# Patient Record
Sex: Female | Born: 1975 | Race: White | Hispanic: No | Marital: Married | Smoking: Never smoker
Health system: Southern US, Community
[De-identification: ages and names within clinical notes are randomized; demographics above are authoritative.]

## PROBLEM LIST (undated history)

## (undated) DIAGNOSIS — K649 Unspecified hemorrhoids: Secondary | ICD-10-CM

## (undated) DIAGNOSIS — I4891 Unspecified atrial fibrillation: Secondary | ICD-10-CM

## (undated) DIAGNOSIS — N390 Urinary tract infection, site not specified: Secondary | ICD-10-CM

## (undated) DIAGNOSIS — K589 Irritable bowel syndrome without diarrhea: Secondary | ICD-10-CM

## (undated) DIAGNOSIS — R519 Headache, unspecified: Secondary | ICD-10-CM

## (undated) DIAGNOSIS — F32A Depression, unspecified: Secondary | ICD-10-CM

## (undated) DIAGNOSIS — E538 Deficiency of other specified B group vitamins: Secondary | ICD-10-CM

## (undated) DIAGNOSIS — B019 Varicella without complication: Secondary | ICD-10-CM

## (undated) DIAGNOSIS — E559 Vitamin D deficiency, unspecified: Secondary | ICD-10-CM

## (undated) DIAGNOSIS — R55 Syncope and collapse: Secondary | ICD-10-CM

## (undated) DIAGNOSIS — I499 Cardiac arrhythmia, unspecified: Secondary | ICD-10-CM

## (undated) DIAGNOSIS — M1990 Unspecified osteoarthritis, unspecified site: Secondary | ICD-10-CM

## (undated) DIAGNOSIS — I951 Orthostatic hypotension: Secondary | ICD-10-CM

## (undated) DIAGNOSIS — G43909 Migraine, unspecified, not intractable, without status migrainosus: Secondary | ICD-10-CM

## (undated) HISTORY — DX: Vitamin D deficiency, unspecified: E55.9

## (undated) HISTORY — DX: Headache, unspecified: R51.9

## (undated) HISTORY — DX: Urinary tract infection, site not specified: N39.0

## (undated) HISTORY — DX: Unspecified hemorrhoids: K64.9

## (undated) HISTORY — DX: Unspecified osteoarthritis, unspecified site: M19.90

## (undated) HISTORY — DX: Varicella without complication: B01.9

## (undated) HISTORY — DX: Migraine, unspecified, not intractable, without status migrainosus: G43.909

## (undated) HISTORY — DX: Unspecified atrial fibrillation: I48.91

## (undated) HISTORY — DX: Orthostatic hypotension: I95.1

## (undated) HISTORY — DX: Irritable bowel syndrome, unspecified: K58.9

## (undated) HISTORY — DX: Depression, unspecified: F32.A

## (undated) HISTORY — DX: Deficiency of other specified B group vitamins: E53.8

## (undated) HISTORY — DX: Syncope and collapse: R55

## (undated) HISTORY — DX: Cardiac arrhythmia, unspecified: I49.9

---

## 2002-02-13 ENCOUNTER — Emergency Department (HOSPITAL_COMMUNITY): Admission: EM | Admit: 2002-02-13 | Discharge: 2002-02-13 | Payer: Self-pay | Admitting: *Deleted

## 2006-10-08 ENCOUNTER — Inpatient Hospital Stay (HOSPITAL_COMMUNITY): Admission: AD | Admit: 2006-10-08 | Discharge: 2006-10-13 | Payer: Self-pay | Admitting: Obstetrics & Gynecology

## 2006-10-09 ENCOUNTER — Encounter (INDEPENDENT_AMBULATORY_CARE_PROVIDER_SITE_OTHER): Payer: Self-pay | Admitting: Specialist

## 2007-12-11 ENCOUNTER — Emergency Department (HOSPITAL_COMMUNITY): Admission: EM | Admit: 2007-12-11 | Discharge: 2007-12-11 | Payer: Self-pay | Admitting: Emergency Medicine

## 2007-12-31 ENCOUNTER — Encounter: Admission: RE | Admit: 2007-12-31 | Discharge: 2007-12-31 | Payer: Self-pay | Admitting: General Surgery

## 2009-09-05 ENCOUNTER — Inpatient Hospital Stay (HOSPITAL_COMMUNITY): Admission: RE | Admit: 2009-09-05 | Discharge: 2009-09-08 | Payer: Self-pay | Admitting: Obstetrics and Gynecology

## 2009-11-01 IMAGING — CR DG HAND 2V*L*
2 series · 2 of 2 positions shown · non-contrast
Comparison: Plain films 12/11/2007

CLINICAL DATA: Fourth metacarpal fracture.  Status post casting.

LEFT HAND - 2 VIEW

[view not recorded (1 of 2)]
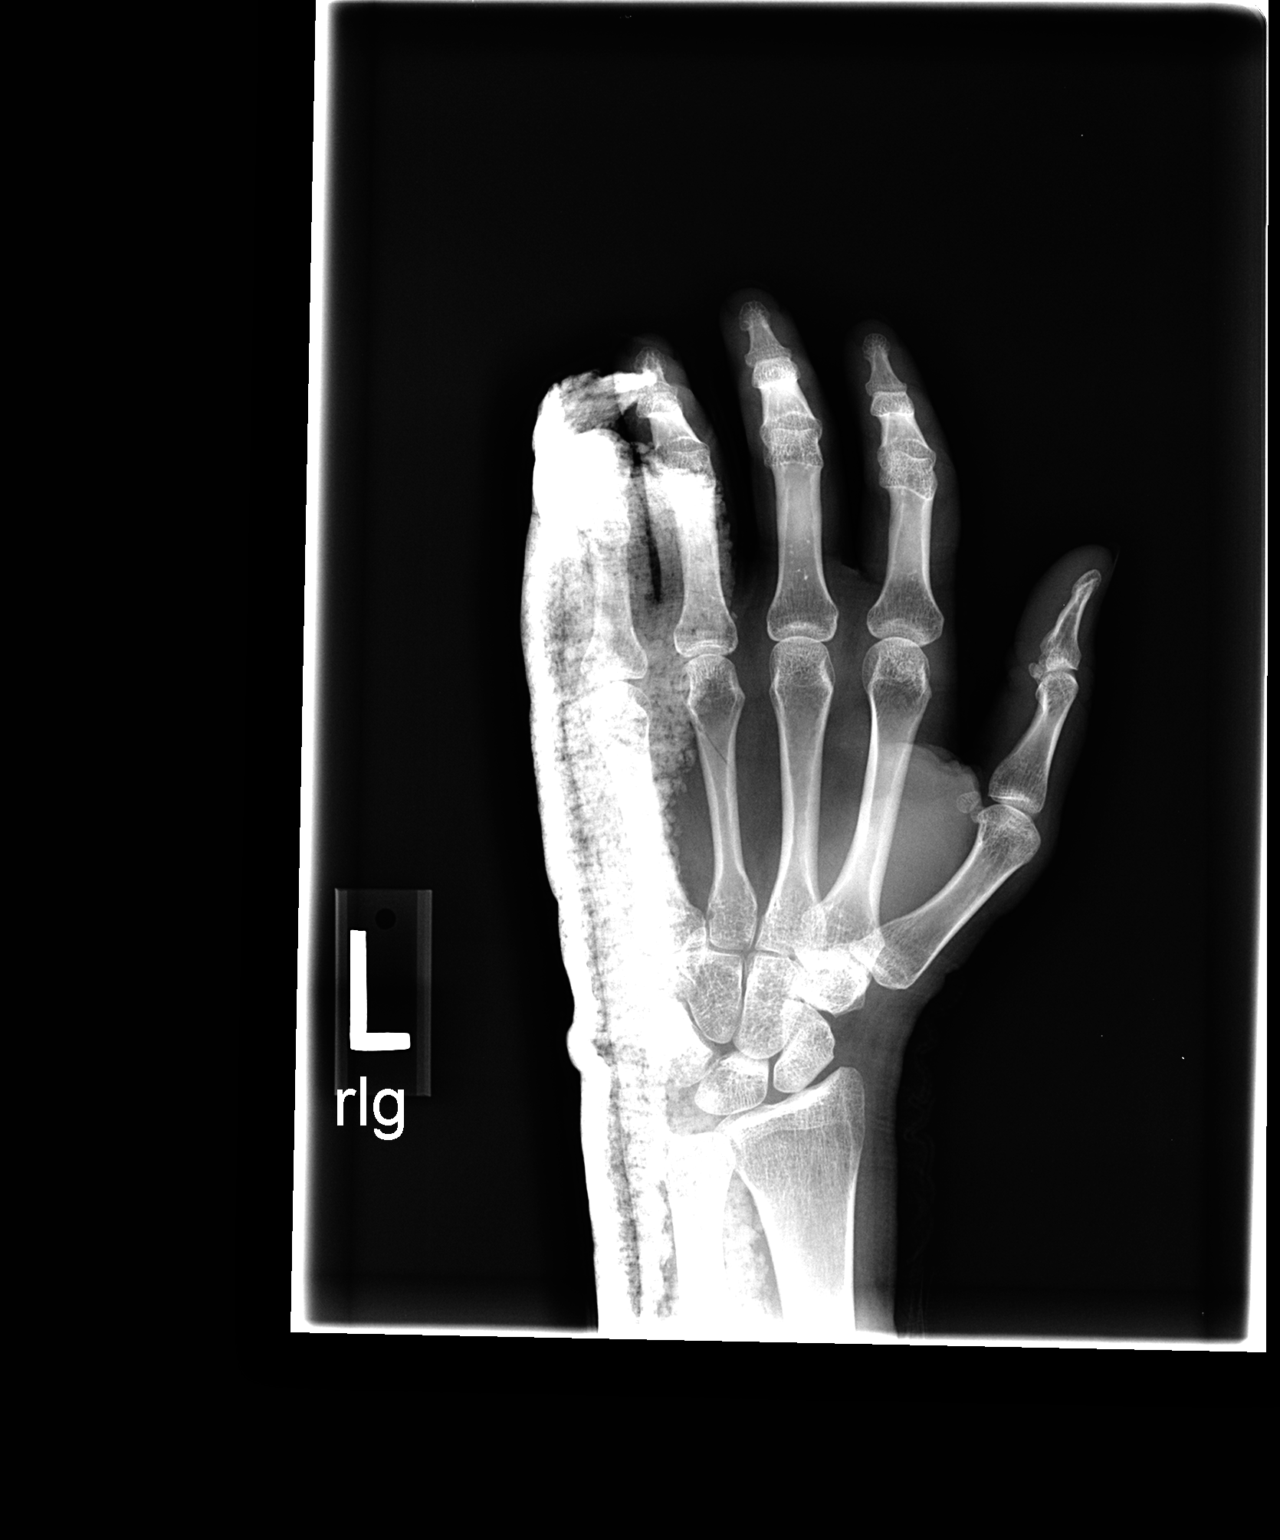

[view not recorded (2 of 2)]
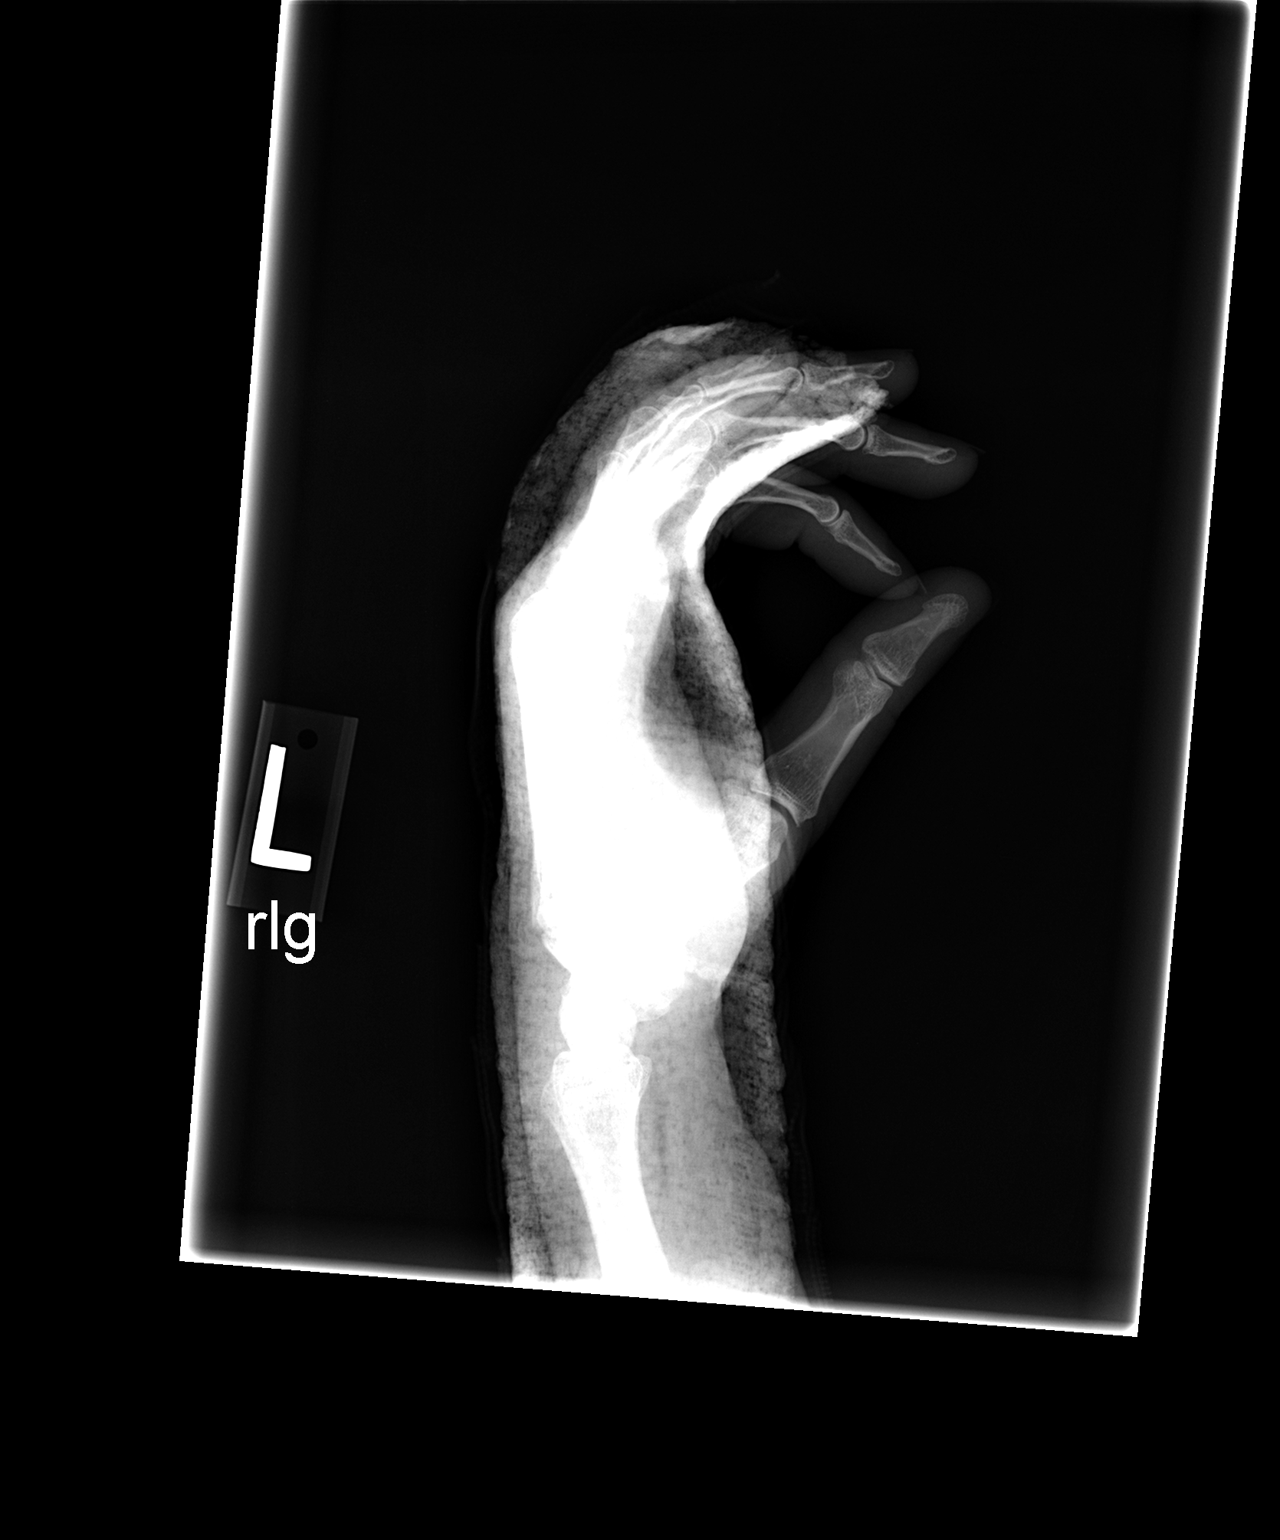

[2 of 2 positions shown; findings below may reference images not displayed]

FINDINGS: The patient is in a plaster splint.  As on the prior
examination, a nondisplaced fracture of the distal aspect of the
fourth metacarpal is identified.  There has been some bony
resorption about the fracture site with the knee fracture line
clearly visible..  No new abnormality.
IMPRESSION: Healing fourth metacarpal fracture without change in position or
alignment.

## 2010-09-08 LAB — CBC
HCT: 31.6 % — ABNORMAL LOW (ref 36.0–46.0)
Hemoglobin: 10.9 g/dL — ABNORMAL LOW (ref 12.0–15.0)
Hemoglobin: 13.2 g/dL (ref 12.0–15.0)
MCV: 95.7 fL (ref 78.0–100.0)
RBC: 3.3 MIL/uL — ABNORMAL LOW (ref 3.87–5.11)
RBC: 4.09 MIL/uL (ref 3.87–5.11)
WBC: 10.2 10*3/uL (ref 4.0–10.5)

## 2010-11-01 NOTE — Discharge Summary (Signed)
[name], Paula Cooke NO.:  [id number]   MEDICAL RECORD NO.:  [id number]           PATIENT TYPE:   LOCATION:                                FACILITY:  [facility]   PHYSICIAN:  Genia Del, M.D.DATE OF BIRTH:  Feb 17, 1976   DATE OF ADMISSION:  10/08/2006  DATE OF DISCHARGE:  10/13/2006                               DISCHARGE SUMMARY   ADMISSION DIAGNOSIS:  Forty plus weeks' gestation, spontaneous labor.   DISCHARGE DIAGNOSIS:  Forty plus weeks' gestation, spontaneous labor,  nonreassuring fetal heart rate monitoring with fetal bradycardia, two-  vessel cord.   PROCEDURE:  Urgent low transverse C-section.  Birth of a baby boy.  PH  7.25.  No complication with the C-section.  The estimated blood loss was  400 mL.  Postoperative evaluation was normal.  The patient was  discharged home in stable status on postoperative day #4.  She had a  blood patch by anesthesia for a spinal headache.  Postoperative advice  was given.  Her postoperative hemoglobin was 9.9.  Pain medication was  prescribed, and she will follow-up at [facility] in 6 weeks.      Genia Del, M.D.  Electronically Signed     ML/MEDQ  D:  12/08/2006  T:  12/08/2006  Job:  [id number]

## 2010-11-01 NOTE — Op Note (Signed)
[name], Paula Cooke            ACCOUNT NO.:  [id number]   MEDICAL RECORD NO.:  [id number]          PATIENT TYPE:  INP   LOCATION:  9119                          FACILITY:  [facility]   PHYSICIAN:  Genia Del, M.D.DATE OF BIRTH:  18-Nov-1975   DATE OF PROCEDURE:  10/09/2006  DATE OF DISCHARGE:                               OPERATIVE REPORT   PREOPERATIVE DIAGNOSES:  1. Forty plus weeks' gestation.  2. Fetal bradycardia in second stage of labor.  3. Two-vessel cord.   POSTOPERATIVE DIAGNOSES:  1. Forty plus weeks' gestation.  2. Fetal bradycardia in second stage of labor.  3. Two-vessel cord.  4. Two nuchal cords.   PROCEDURE:  Urgent low transverse cesarean section.   SURGEON:  Genia Del, M.D.  No assistant.   ANESTHESIOLOGIST:  Belva Agee, M.D.   PROCEDURE:  Under epidural anesthesia, the patient is in 15-degree left  decubitus position.  She is prepped with Betadine on the abdominal,  suprapubic and vulvar areas.  The bladder catheter is already in place  and the patient is draped as usual.  We make a Pfannenstiel incision  with a scalpel, open the adipose tissue with the scalpel, open the  aponeurosis transversely with Mayo scissors, detach the recti muscles  from the aponeurosis in the midline, open the parietal peritoneum  longitudinally with [employer].  We then opened the visceral  peritoneum transversely over the lower uterine segment, reclined the  bladder downward.  The bladder retractor is in place.  We make a low  transverse hysterotomy with a scalpel, extension on each side with the  fingers.  The amniotic fluid is clear.  The fetus is in cephalic  presentation.  Birth of a baby boy at 5:20.  the cord is clamped and  cut.  The baby was suctioned and given to the neonatal team.  Apgars are  9 and 9, pH is 7.25.  We then extract the placenta manually.  It has two  vessels.  It is sent to pathology.  We make a uterine revision.  Pitocin  was  started in the IV fluid.  The uterus contracts well.  A dose of  Ancef 1 g IV given.  We close the hysterectomy and a first locked  running suture of Vicryl 0, the second layer in a mattress fashion is  done with Vicryl 0.  Hemostasis is good.  Both ovaries are normal to  inspection.  The uterus is normal except for one very small less than 1  cm subserosal myoma.  We irrigate and suction the abdominopelvic cavity.  Hemostasis is completed on the recti muscles with the electrocautery.  We close the aponeurosis in one running suture of Vicryl 0.  Hemostasis  is completed on the adipose tissue and the skin is reapproximated with  staples.  A dry dressing is applied.  The estimated blood loss is 400  mL.  No complication  occurred and the patient was brought to recovery room in good, stable  status.  Note that an x-ray was done of the abdomen and the pelvis to  assure that no instruments and sponges  were left given that the count  was not done.  The x-ray was negative.      Genia Del, M.D.  Electronically Signed     ML/MEDQ  D:  10/09/2006  T:  10/09/2006  Job:  [id number]

## 2011-03-13 LAB — POCT I-STAT, CHEM 8
BUN: 12
Calcium, Ion: 1.1 — ABNORMAL LOW
Creatinine, Ser: 0.9
TCO2: 23

## 2013-02-25 DIAGNOSIS — F411 Generalized anxiety disorder: Secondary | ICD-10-CM | POA: Insufficient documentation

## 2016-03-10 ENCOUNTER — Ambulatory Visit (INDEPENDENT_AMBULATORY_CARE_PROVIDER_SITE_OTHER): Payer: BLUE CROSS/BLUE SHIELD | Admitting: Psychology

## 2016-03-10 DIAGNOSIS — F4323 Adjustment disorder with mixed anxiety and depressed mood: Secondary | ICD-10-CM | POA: Diagnosis not present

## 2016-03-25 ENCOUNTER — Ambulatory Visit (INDEPENDENT_AMBULATORY_CARE_PROVIDER_SITE_OTHER): Payer: BLUE CROSS/BLUE SHIELD | Admitting: Psychology

## 2016-03-25 DIAGNOSIS — F411 Generalized anxiety disorder: Secondary | ICD-10-CM

## 2016-04-14 ENCOUNTER — Ambulatory Visit (INDEPENDENT_AMBULATORY_CARE_PROVIDER_SITE_OTHER): Payer: BLUE CROSS/BLUE SHIELD | Admitting: Psychology

## 2016-04-14 DIAGNOSIS — F4323 Adjustment disorder with mixed anxiety and depressed mood: Secondary | ICD-10-CM | POA: Diagnosis not present

## 2016-04-29 ENCOUNTER — Ambulatory Visit (INDEPENDENT_AMBULATORY_CARE_PROVIDER_SITE_OTHER): Payer: BLUE CROSS/BLUE SHIELD | Admitting: Psychology

## 2016-04-29 DIAGNOSIS — F4323 Adjustment disorder with mixed anxiety and depressed mood: Secondary | ICD-10-CM

## 2016-05-13 ENCOUNTER — Ambulatory Visit (INDEPENDENT_AMBULATORY_CARE_PROVIDER_SITE_OTHER): Payer: BLUE CROSS/BLUE SHIELD | Admitting: Psychology

## 2016-05-13 DIAGNOSIS — F4323 Adjustment disorder with mixed anxiety and depressed mood: Secondary | ICD-10-CM

## 2016-05-27 ENCOUNTER — Ambulatory Visit (INDEPENDENT_AMBULATORY_CARE_PROVIDER_SITE_OTHER): Payer: BLUE CROSS/BLUE SHIELD | Admitting: Psychology

## 2016-05-27 DIAGNOSIS — F4323 Adjustment disorder with mixed anxiety and depressed mood: Secondary | ICD-10-CM

## 2016-06-17 ENCOUNTER — Ambulatory Visit: Payer: Self-pay | Admitting: Psychology

## 2016-07-01 ENCOUNTER — Ambulatory Visit: Payer: BLUE CROSS/BLUE SHIELD | Admitting: Psychology

## 2019-03-17 ENCOUNTER — Other Ambulatory Visit: Payer: Self-pay | Admitting: Emergency Medicine

## 2019-03-17 DIAGNOSIS — Z20822 Contact with and (suspected) exposure to covid-19: Secondary | ICD-10-CM

## 2019-03-18 LAB — NOVEL CORONAVIRUS, NAA: SARS-CoV-2, NAA: NOT DETECTED

## 2019-06-17 HISTORY — PX: CARDIAC ELECTROPHYSIOLOGY MAPPING AND ABLATION: SHX1292

## 2020-06-16 DIAGNOSIS — N6009 Solitary cyst of unspecified breast: Secondary | ICD-10-CM

## 2020-06-16 HISTORY — DX: Solitary cyst of unspecified breast: N60.09

## 2021-02-13 LAB — HM MAMMOGRAPHY

## 2021-07-25 LAB — HM DEXA SCAN: HM Dexa Scan: NORMAL

## 2021-08-08 LAB — TSH: TSH: 1.84 (ref 0.41–5.90)

## 2021-08-08 LAB — BASIC METABOLIC PANEL
BUN: 8 (ref 4–21)
CO2: 21 (ref 13–22)
Chloride: 105 (ref 99–108)
Creatinine: 0.9 (ref 0.5–1.1)
Glucose: 87
Potassium: 4.7 mEq/L (ref 3.5–5.1)
Sodium: 138 (ref 137–147)

## 2021-08-08 LAB — HEPATIC FUNCTION PANEL
ALT: 14 U/L (ref 7–35)
AST: 18 (ref 13–35)
Alkaline Phosphatase: 66 (ref 25–125)
Bilirubin, Total: 0.3

## 2021-08-08 LAB — CBC AND DIFFERENTIAL
HCT: 43 (ref 36–46)
Hemoglobin: 13.9 (ref 12.0–16.0)
Neutrophils Absolute: 2.6
Platelets: 289 10*3/uL (ref 150–400)
WBC: 5.1

## 2021-08-08 LAB — COMPREHENSIVE METABOLIC PANEL
Albumin: 4.4 (ref 3.5–5.0)
Calcium: 9.2 (ref 8.7–10.7)
Globulin: 2.2
eGFR: 78

## 2021-08-08 LAB — CBC: RBC: 4.69 (ref 3.87–5.11)

## 2021-08-08 LAB — VITAMIN D 25 HYDROXY (VIT D DEFICIENCY, FRACTURES): Vit D, 25-Hydroxy: 67.5

## 2021-08-08 LAB — LIPID PANEL
Cholesterol: 171 (ref 0–200)
HDL: 45 (ref 35–70)
LDL Cholesterol: 107
LDl/HDL Ratio: 2.3
Triglycerides: 101 (ref 40–160)

## 2021-08-08 LAB — VITAMIN B12: Vitamin B-12: >2000

## 2021-09-27 DIAGNOSIS — I340 Nonrheumatic mitral (valve) insufficiency: Secondary | ICD-10-CM

## 2021-09-27 HISTORY — DX: Nonrheumatic mitral (valve) insufficiency: I34.0

## 2022-01-20 LAB — POCT ERYTHROCYTE SEDIMENTATION RATE, NON-AUTOMATED: Sed Rate: 3

## 2022-02-14 HISTORY — PX: COLONOSCOPY: SHX174

## 2022-03-09 LAB — HM PAP SMEAR: Pap: NEGATIVE

## 2022-03-15 LAB — HM COLONOSCOPY

## 2022-04-08 ENCOUNTER — Encounter: Payer: Self-pay | Admitting: Family Medicine

## 2022-04-10 ENCOUNTER — Encounter: Payer: Self-pay | Admitting: Family Medicine

## 2022-04-11 ENCOUNTER — Ambulatory Visit (INDEPENDENT_AMBULATORY_CARE_PROVIDER_SITE_OTHER): Payer: No Typology Code available for payment source | Admitting: Family Medicine

## 2022-04-11 ENCOUNTER — Encounter: Payer: Self-pay | Admitting: Family Medicine

## 2022-04-11 VITALS — BP 124/84 | HR 110 | Temp 98.3°F | Ht 66.5 in | Wt 152.4 lb

## 2022-04-11 DIAGNOSIS — Z8679 Personal history of other diseases of the circulatory system: Secondary | ICD-10-CM

## 2022-04-11 DIAGNOSIS — G43909 Migraine, unspecified, not intractable, without status migrainosus: Secondary | ICD-10-CM

## 2022-04-11 DIAGNOSIS — Z23 Encounter for immunization: Secondary | ICD-10-CM | POA: Diagnosis not present

## 2022-04-11 DIAGNOSIS — M2550 Pain in unspecified joint: Secondary | ICD-10-CM

## 2022-04-11 DIAGNOSIS — Z9889 Other specified postprocedural states: Secondary | ICD-10-CM

## 2022-04-11 DIAGNOSIS — Z7689 Persons encountering health services in other specified circumstances: Secondary | ICD-10-CM

## 2022-04-11 DIAGNOSIS — F411 Generalized anxiety disorder: Secondary | ICD-10-CM

## 2022-04-11 LAB — C-REACTIVE PROTEIN: CRP: <1 mg/dL (ref 0.5–20.0)

## 2022-04-11 LAB — SEDIMENTATION RATE: Sed Rate: 11 mm/hr (ref 0–20)

## 2022-04-11 MED ORDER — DULOXETINE HCL 30 MG PO CPEP: 30.0000 mg | ORAL_CAPSULE | Freq: Every day | ORAL | 1 refills | Status: DC

## 2022-04-11 NOTE — Progress Notes (Unsigned)
Patient ID: Paula Cooke, female  DOB: 12-Mar-1976, 46 y.o.   MRN: [id number] Patient Care Team    Relationship Specialty Notifications Start End  Ma Hillock, DO PCP - General Family Medicine  04/11/22   Hart Carwin, MD Referring Physician Cardiology  04/08/22   Eusebio Friendly, MD Referring Physician Gastroenterology  04/08/22   Preston Fleeting, [place] Physician Assistant Neurology  04/10/22     Chief Complaint  Patient presents with   Establish Care    Np Est care pt c/o inflammation in joints. Not fasting    Subjective:  Paula Cooke is a 46 y.o.  female present for new patient establishment. All past medical history, surgical history, allergies, family history, immunizations, medications and social history were updated in the electronic medical record today. All recent labs, ED visits and hospitalizations within the last year were reviewed.  ***       No data to display             No data to display                 No data to display           Immunization History  Administered Date(s) Administered   Influenza Inj Mdck Quad Pf 03/28/2019, 03/18/2021   Influenza Inj Mdck Quad With Preservative 05/14/2015   Influenza Split 03/28/2019   Influenza, Quadrivalent, Recombinant, Inj, Pf 03/17/2017   Influenza,inj,Quad PF,6+ Mos 03/22/2018   Influenza-Unspecified 05/26/2012, 02/25/2013, 04/10/2014, 05/18/2016, 05/18/2017   Tdap 03/12/2013    No results found.  Past Medical History:  Diagnosis Date   Arrhythmia    Arthritis    Chicken pox    Depression    Fainting episodes    Frequent headaches    Migraines    Recurrent UTI (urinary tract infection)    Allergies  Allergen Reactions   Doxycycline Hyclate     Other reaction(s): Other Hot flashes    *** The histories are not reviewed yet. Please review them in the "History" navigator section and refresh this [place]. Family History  Problem Relation Age of Onset    Alzheimer's disease Mother    Stroke Father    Hypertension Father    Hypertension Sister    Arthritis Maternal Grandmother    Heart attack Maternal Grandmother    Alzheimer's disease Maternal Grandfather    Cancer Paternal Grandmother    Stroke Paternal Grandfather    Social History   Social History Narrative   Marital status/children/pets: Married.  2 children.   Education/employment: College degree.   Safety:      -smoke alarm in the home:Yes     - wears seatbelt: Yes     - Feels safe in their relationships: Yes       Allergies as of 04/11/2022       Reactions   Doxycycline Hyclate    Other reaction(s): Other Hot flashes         Medication List        Accurate as of April 11, 2022 10:00 AM. If you have any questions, ask your nurse or doctor.          Ajovy 225 MG/1.5ML Soaj Generic drug: Fremanezumab-vfrm Inject into the skin.   aspirin 81 MG chewable tablet Chew by mouth.   baclofen 20 MG tablet Commonly known as: LIORESAL TAKE ONE TABLET EVERY 8 HOURS AS NEEDED FOR MODERATE HEADACHE PAIN.   busPIRone 15 MG tablet Commonly known  as: BUSPAR Take 15 mg by mouth 2 (two) times daily.   CRANBERRY PO Take by mouth.   DAILY FIBER PO Take by mouth.   MAGNESIUM PO Take by mouth daily.   melatonin 3 MG Tabs tablet Take 3 mg by mouth as needed.   naratriptan 2.5 MG tablet Commonly known as: AMERGE Take 2.5 mg by mouth daily.   PROBIOTIC PO Take by mouth daily.   Riboflavin 400 MG Tabs Take 400 mg by mouth daily.   zonisamide 25 MG capsule Commonly known as: ZONEGRAN Take 25 mg by mouth 3 (three) times daily.        All past medical history, surgical history, allergies, family history, immunizations andmedications were updated in the EMR today and reviewed under the history and medication portions of their EMR.    No results found for this or any previous visit (from the past 2160 hour(s)).  No results found.   ROS 14 pt review  of systems performed and negative (unless mentioned in an HPI)  Objective:  There were no vitals taken for this visit.  Physical Exam      Assessment/plan: Paula Cooke is a 46 y.o. female present for ***   Patient was encouraged to exercise greater than 150 minutes a week. Patient was encouraged to choose a diet filled with fresh fruits and vegetables, and lean meats. AVS provided to patient today for education/recommendation on gender specific health and safety maintenance. No follow-ups on file.  No orders of the defined types were placed in this encounter.  No orders of the defined types were placed in this encounter.  Referral Orders  No referral(s) requested today     Note is dictated utilizing voice recognition software. Although note has been proof read prior to signing, occasional typographical errors still can be missed. If any questions arise, please do not hesitate to call for verification.  Electronically signed by: Felix Pacini, DO [facility] [place]

## 2022-04-11 NOTE — Patient Instructions (Signed)
No follow-ups on file.        Great to see you today.  I have refilled the medication(s) we provide.   If labs were collected, we will inform you of lab results once received either by echart message or telephone call.   - echart message- for normal results that have been seen by the patient already.   - telephone call: abnormal results or if patient has not viewed results in their echart.  

## 2022-04-12 LAB — RHEUMATOID FACTOR: Rhuematoid fact SerPl-aCnc: <14 IU/mL (ref <14)

## 2022-04-13 LAB — ANA, IFA COMPREHENSIVE PANEL
Anti Nuclear Antibody (ANA): POSITIVE — AB
ENA SM Ab Ser-aCnc: <1 AI
SM/RNP: <1 AI
SSA (Ro) (ENA) Antibody, IgG: <1 AI
SSB (La) (ENA) Antibody, IgG: <1 AI
Scleroderma (Scl-70) (ENA) Antibody, IgG: <1 AI
ds DNA Ab: <1 IU/mL

## 2022-04-13 LAB — ANTI-NUCLEAR AB-TITER (ANA TITER)
ANA TITER: 1:320 {titer} — ABNORMAL HIGH
ANA Titer 1: 1:80 {titer} — ABNORMAL HIGH

## 2022-04-13 LAB — CYCLIC CITRUL PEPTIDE ANTIBODY, IGG: Cyclic Citrullin Peptide Ab: <16 UNITS

## 2022-04-14 ENCOUNTER — Telehealth: Payer: Self-pay | Admitting: Family Medicine

## 2022-04-14 ENCOUNTER — Encounter: Payer: Self-pay | Admitting: Family Medicine

## 2022-04-14 DIAGNOSIS — Z9889 Other specified postprocedural states: Secondary | ICD-10-CM | POA: Insufficient documentation

## 2022-04-14 DIAGNOSIS — M2550 Pain in unspecified joint: Secondary | ICD-10-CM | POA: Insufficient documentation

## 2022-04-14 NOTE — Telephone Encounter (Signed)
Please inform patient: Her autoimmune lab came back with a positive titer.  It is a lower end positive titer which sometimes can reflect early autoimmune disorder and sometimes is considered normal. I have referred her to rheumatology to further evaluate now that we have a positive lab work.  They should be calling her to get her scheduled. Her rheumatoid factor is negative

## 2022-04-14 NOTE — Telephone Encounter (Signed)
Spoke with patient regarding results/recommendations.  

## 2022-05-23 ENCOUNTER — Ambulatory Visit: Payer: No Typology Code available for payment source | Admitting: Family Medicine

## 2022-05-23 ENCOUNTER — Encounter: Payer: Self-pay | Admitting: Family Medicine

## 2022-05-23 VITALS — BP 123/83 | HR 115 | Temp 98.2°F | Ht 66.5 in | Wt 153.0 lb

## 2022-05-23 DIAGNOSIS — F411 Generalized anxiety disorder: Secondary | ICD-10-CM | POA: Diagnosis not present

## 2022-05-23 DIAGNOSIS — M2550 Pain in unspecified joint: Secondary | ICD-10-CM | POA: Diagnosis not present

## 2022-05-23 MED ORDER — FLUOXETINE HCL 10 MG PO CAPS: 10.0000 mg | ORAL_CAPSULE | Freq: Every day | ORAL | 1 refills | Status: DC

## 2022-05-23 NOTE — Progress Notes (Signed)
Patient ID: Paula Cooke, female  DOB: 1975/11/03, 46 y.o.   MRN: [id number] Patient Care Team    Relationship Specialty Notifications Start End  Ma Hillock, DO PCP - General Family Medicine  04/11/22   Hart Carwin, MD Referring Physician Cardiology  04/08/22   Eusebio Friendly, MD Referring Physician Gastroenterology  04/08/22   Preston Fleeting, [place] Physician Assistant Neurology  04/10/22     Chief Complaint  Patient presents with   Depression    Cmc; pt is not fasting    Subjective:  Paula Cooke is a 46 y.o.  female present for follow up All past medical history, surgical history, allergies, family history, immunizations, medications and social history were updated in the electronic medical record today. All recent labs, ED visits and hospitalizations within the last year were reviewed. GAD (generalized anxiety disorder) Patient reports she started the Cymbalta for about 1.5 weeks and she felt awful.  She states she fell off and very blunted in her emotions.  She states she is agreeable to trying a different medication today to help with her anxiety.  Prior note: Patient reports she was started on BuSpar for her anxiety and it worked okay.  She does not feel it is giving her adequate coverage at this time.  She is compliant with BuSpar 15 mg twice daily.  She states she was also prescribed Prozac but she never started the medication.  She has also been prescribed Klonopin in the past, but does not use frequently and is not using currently.      05/23/2022   10:25 AM 04/11/2022   10:34 AM 04/11/2022   10:03 AM  Depression screen PHQ 2/9  Decreased Interest 0 1 1  Down, Depressed, Hopeless 0 1 1  PHQ - 2 Score 0 2 2  Altered sleeping 1    Tired, decreased energy 1    Change in appetite 0    Feeling bad or failure about yourself  0    Trouble concentrating 0    Moving slowly or fidgety/restless 0    Suicidal thoughts 0    PHQ-9 Score 2         05/23/2022   10:25 AM  GAD 7 : Generalized Anxiety Score  Nervous, Anxious, on Edge 2  Control/stop worrying 0  Worry too much - different things 1  Trouble relaxing 0  Restless 0  Easily annoyed or irritable 1  Afraid - awful might happen 0  Total GAD 7 Score 4          04/11/2022   10:03 AM  Fall Risk   Falls in the past year? 0  Number falls in past yr: 0  Injury with Fall? 0     Immunization History  Administered Date(s) Administered   Influenza Inj Mdck Quad Pf 03/28/2019, 03/18/2021   Influenza Inj Mdck Quad With Preservative 05/14/2015   Influenza Split 03/28/2019   Influenza, Quadrivalent, Recombinant, Inj, Pf 03/17/2017   Influenza,inj,Quad PF,6+ Mos 03/22/2018, 04/11/2022   Influenza-Unspecified 05/26/2012, 02/25/2013, 04/10/2014, 05/18/2016, 05/18/2017   Tdap 03/12/2013    No results found.  Past Medical History:  Diagnosis Date   Arthritis    Atrial fibrillation ([place])    s/p ablation   B12 deficiency    Breast cyst 2022   with aspiration   Chicken pox    Depression    Fainting episodes    Frequent headaches    Hemorrhoid    IBS (irritable bowel  syndrome)    Migraines    Mitral valve regurgitation 09/27/2021   Mild   Orthostatic hypotension    Recurrent UTI (urinary tract infection)    Postcoital   Vitamin D deficiency    Allergies  Allergen Reactions   Doxycycline Hyclate     Other reaction(s): Other Hot flashes    Past Surgical History:  Procedure Laterality Date   CARDIAC ELECTROPHYSIOLOGY MAPPING AND ABLATION  2021   A-Fib   CESAREAN SECTION     2008 & 2011   COLONOSCOPY  02/2022   Dr. Cindee Salt   Family History  Problem Relation Age of Onset   Alzheimer's disease Mother    Stroke Father    Hypertension Father    Hypertension Sister    Arthritis Maternal Grandmother    Heart attack Maternal Grandmother    Alzheimer's disease Maternal Grandfather    Cancer Paternal Grandmother    Stroke Paternal Grandfather    Social History    Social History Narrative   Marital status/children/pets: Married.  2 children.   Education/employment: College degree.   Safety:      -smoke alarm in the home:Yes     - wears seatbelt: Yes     - Feels safe in their relationships: Yes       Allergies as of 05/23/2022       Reactions   Doxycycline Hyclate    Other reaction(s): Other Hot flashes         Medication List        Accurate as of May 23, 2022 12:12 PM. If you have any questions, ask your nurse or doctor.          STOP taking these medications    DULoxetine 30 MG capsule Commonly known as: Cymbalta Stopped by: Howard Pouch, DO       TAKE these medications    Ajovy 225 MG/1.5ML Soaj Generic drug: Fremanezumab-vfrm Inject into the skin.   aspirin 81 MG chewable tablet Chew by mouth.   baclofen 20 MG tablet Commonly known as: LIORESAL TAKE ONE TABLET EVERY 8 HOURS AS NEEDED FOR MODERATE HEADACHE PAIN.   busPIRone 15 MG tablet Commonly known as: BUSPAR Take 15 mg by mouth 2 (two) times daily.   CRANBERRY PO Take by mouth.   DAILY FIBER PO Take by mouth.   FLUoxetine 10 MG capsule Commonly known as: PROZAC Take 1 capsule (10 mg total) by mouth daily. Started by: Howard Pouch, DO   MAGNESIUM PO Take by mouth daily.   melatonin 3 MG Tabs tablet Take 3 mg by mouth as needed.   naratriptan 2.5 MG tablet Commonly known as: AMERGE Take 2.5 mg by mouth daily.   PROBIOTIC PO Take by mouth daily.   Riboflavin 400 MG Tabs Take 400 mg by mouth daily.   zonisamide 50 MG capsule Commonly known as: ZONEGRAN Take 50 mg by mouth at bedtime.        All past medical history, surgical history, allergies, family history, immunizations andmedications were updated in the EMR today and reviewed under the history and medication portions of their EMR.     No results found.   ROS 14 pt review of systems performed and negative (unless mentioned in an HPI)  Objective: BP 123/83   Pulse  (!) 115   Temp 98.2 F (36.8 C) (Oral)   Ht 5' 6.5" (1.689 m)   Wt 153 lb (69.4 kg)   LMP 05/06/2022 (Approximate)   SpO2 100%   BMI 24.32  kg/m  Physical Exam Vitals and nursing note reviewed.  Constitutional:      General: She is not in acute distress.    Appearance: Normal appearance. She is normal weight. She is not ill-appearing or toxic-appearing.  Eyes:     Extraocular Movements: Extraocular movements intact.     Conjunctiva/sclera: Conjunctivae normal.     Pupils: Pupils are equal, round, and reactive to light.  Neurological:     Mental Status: She is alert and oriented to person, place, and time. Mental status is at baseline.  Psychiatric:        Mood and Affect: Mood normal.        Behavior: Behavior normal.        Thought Content: Thought content normal.        Judgment: Judgment normal.     Assessment/plan: Paula Cooke is a 46 y.o. female present for  GAD (generalized anxiety disorder) Reviewed discussed options today for treatment and she is agreeable to try the Prozac 10 mg daily.  She does have a bottle of these at home that she never started from prior. If working well for her follow-up in 3 months and we can taper at that time if needed.  If needing further intervention sooner please follow-up at that time Tried:celexa, lexapro, buspar, cymbalta   Return in about 11 weeks (around 08/08/2022) for Routine chronic condition follow-up.  No orders of the defined types were placed in this encounter.  Meds ordered this encounter  Medications   FLUoxetine (PROZAC) 10 MG capsule    Sig: Take 1 capsule (10 mg total) by mouth daily.    Dispense:  90 capsule    Refill:  1    Hold until pt request   Referral Orders  No referral(s) requested today     Note is dictated utilizing voice recognition software. Although note has been proof read prior to signing, occasional typographical errors still can be missed. If any questions arise, please do not hesitate to  call for verification.  Electronically signed by: Felix Pacini, DO [facility] [place]

## 2022-05-23 NOTE — Patient Instructions (Addendum)
Return in about 11 weeks (around 08/08/2022) for Routine chronic condition follow-up.        Great to see you today.  I have refilled the medication(s) we provide.   If labs were collected, we will inform you of lab results once received either by echart message or telephone call.   - echart message- for normal results that have been seen by the patient already.   - telephone call: abnormal results or if patient has not viewed results in their echart.

## 2022-08-08 ENCOUNTER — Encounter: Payer: Self-pay | Admitting: Family Medicine

## 2022-08-08 ENCOUNTER — Ambulatory Visit: Payer: No Typology Code available for payment source | Admitting: Family Medicine

## 2022-08-08 VITALS — BP 114/80 | HR 100 | Temp 98.2°F | Ht 66.5 in | Wt 152.0 lb

## 2022-08-08 DIAGNOSIS — F411 Generalized anxiety disorder: Secondary | ICD-10-CM

## 2022-08-08 MED ORDER — FLUOXETINE HCL 20 MG PO CAPS: 20.0000 mg | ORAL_CAPSULE | Freq: Every day | ORAL | 1 refills | Status: DC

## 2022-08-08 NOTE — Patient Instructions (Signed)
Return in about 24 weeks (around 01/23/2023) for cpe (20 min), Routine chronic condition follow-up.        Great to see you today.  I have refilled the medication(s) we provide.   If labs were collected, we will inform you of lab results once received either by echart message or telephone call.   - echart message- for normal results that have been seen by the patient already.   - telephone call: abnormal results or if patient has not viewed results in their echart.

## 2022-08-08 NOTE — Progress Notes (Signed)
Patient ID: Paula Cooke, female  DOB: 11-14-1975, 47 y.o.   MRN: [id number] Patient Care Team    Relationship Specialty Notifications Start End  Ma Hillock, DO PCP - General Family Medicine  04/11/22   Hart Carwin, MD Referring Physician Cardiology  04/08/22   Eusebio Friendly, MD Referring Physician Gastroenterology  04/08/22   Preston Fleeting, [place] Physician Assistant Neurology  04/10/22   Gerald Dexter, MD Referring Physician Obstetrics and Gynecology  08/08/22     Chief Complaint  Patient presents with   Anxiety    Pt is not fasting     Subjective:  Paula Cooke is a 47 y.o.  female present  GAD (generalized anxiety disorder) last visit patient was started on Prozac 10 mg qd, today she states she feels better, but still feels more irritable. She denies any negative side effects from medication.  Prior note Patient reports she started the Cymbalta for about 1.5 weeks and she felt awful.  She states she fell off and very blunted in her emotions.  She states she is agreeable to trying a different medication today to help with her anxiety.  Prior note: Patient reports she was started on BuSpar for her anxiety and it worked okay.  She does not feel it is giving her adequate coverage at this time.  She is compliant with BuSpar 15 mg twice daily.  She states she was also prescribed Prozac but she never started the medication.  She has also been prescribed Klonopin in the past, but does not use frequently and is not using currently.      08/08/2022   10:14 AM 05/23/2022   10:25 AM 04/11/2022   10:34 AM 04/11/2022   10:03 AM  Depression screen PHQ 2/9  Decreased Interest 0 0 1 1  Down, Depressed, Hopeless 0 0 1 1  PHQ - 2 Score 0 0 2 2  Altered sleeping 0 1    Tired, decreased energy 1 1    Change in appetite 0 0    Feeling bad or failure about yourself  0 0    Trouble concentrating 0 0    Moving slowly or fidgety/restless 0 0    Suicidal  thoughts 0 0    PHQ-9 Score 1 2        08/08/2022   10:14 AM 05/23/2022   10:25 AM  GAD 7 : Generalized Anxiety Score  Nervous, Anxious, on Edge 2 2  Control/stop worrying 0 0  Worry too much - different things 0 1  Trouble relaxing 0 0  Restless 1 0  Easily annoyed or irritable 2 1  Afraid - awful might happen 0 0  Total GAD 7 Score 5 4          04/11/2022   10:03 AM  Fall Risk   Falls in the past year? 0  Number falls in past yr: 0  Injury with Fall? 0     Immunization History  Administered Date(s) Administered   Influenza Inj Mdck Quad Pf 03/28/2019, 03/18/2021   Influenza Inj Mdck Quad With Preservative 05/14/2015   Influenza Split 03/28/2019   Influenza, Quadrivalent, Recombinant, Inj, Pf 03/17/2017   Influenza,inj,Quad PF,6+ Mos 03/22/2018, 04/11/2022   Influenza-Unspecified 05/26/2012, 02/25/2013, 04/10/2014, 05/18/2016, 05/18/2017   Tdap 03/12/2013    No results found.  Past Medical History:  Diagnosis Date   Arthritis    Atrial fibrillation [facility])    s/p ablation   B12 deficiency  Breast cyst 2022   with aspiration   Chicken pox    Depression    Fainting episodes    Frequent headaches    Hemorrhoid    IBS (irritable bowel syndrome)    Migraines    Mitral valve regurgitation 09/27/2021   Mild   Orthostatic hypotension    Recurrent UTI (urinary tract infection)    Postcoital   Vitamin D deficiency    Allergies  Allergen Reactions   Doxycycline Hyclate     Other reaction(s): Other Hot flashes    Past Surgical History:  Procedure Laterality Date   CARDIAC ELECTROPHYSIOLOGY MAPPING AND ABLATION  2021   A-Fib   CESAREAN SECTION     2008 & 2011   COLONOSCOPY  02/2022   Dr. Cindee Salt   Family History  Problem Relation Age of Onset   Alzheimer's disease Mother    Stroke Father    Hypertension Father    Hypertension Sister    Arthritis Maternal Grandmother    Heart attack Maternal Grandmother    Alzheimer's disease Maternal Grandfather     Cancer Paternal Grandmother    Stroke Paternal Grandfather    Social History   Social History Narrative   Marital status/children/pets: Married.  2 children.   Education/employment: College degree.   Safety:      -smoke alarm in the home:Yes     - wears seatbelt: Yes     - Feels safe in their relationships: Yes       Allergies as of 08/08/2022       Reactions   Doxycycline Hyclate    Other reaction(s): Other Hot flashes         Medication List        Accurate as of August 08, 2022 10:27 AM. If you have any questions, ask your nurse or doctor.          STOP taking these medications    busPIRone 15 MG tablet Commonly known as: BUSPAR Stopped by: Howard Pouch, DO       TAKE these medications    Ajovy 225 MG/1.5ML Soaj Generic drug: Fremanezumab-vfrm Inject into the skin.   aspirin 81 MG chewable tablet Chew by mouth.   baclofen 20 MG tablet Commonly known as: LIORESAL TAKE ONE TABLET EVERY 8 HOURS AS NEEDED FOR MODERATE HEADACHE PAIN.   CRANBERRY PO Take by mouth.   DAILY FIBER PO Take by mouth.   FLUoxetine 20 MG capsule Commonly known as: PROZAC Take 1 capsule (20 mg total) by mouth daily. What changed:  medication strength how much to take Changed by: Howard Pouch, DO   MAGNESIUM PO Take by mouth daily.   melatonin 3 MG Tabs tablet Take 3 mg by mouth as needed.   naratriptan 2.5 MG tablet Commonly known as: AMERGE Take 2.5 mg by mouth daily.   PROBIOTIC PO Take by mouth daily.   Riboflavin 400 MG Tabs Take 400 mg by mouth daily.   zonisamide 50 MG capsule Commonly known as: ZONEGRAN Take 50 mg by mouth at bedtime.        All past medical history, surgical history, allergies, family history, immunizations andmedications were updated in the EMR today and reviewed under the history and medication portions of their EMR.     No results found.   ROS 14 pt review of systems performed and negative (unless mentioned in an  HPI)  Objective: BP 114/80   Pulse 100   Temp 98.2 F (36.8 C) (Oral)   Ht  5' 6.5" (1.689 m)   Wt 152 lb (68.9 kg)   LMP  (LMP Unknown)   SpO2 99%   BMI 24.17 kg/m  Physical Exam Vitals and nursing note reviewed.  Constitutional:      General: She is not in acute distress.    Appearance: Normal appearance. She is normal weight. She is not ill-appearing or toxic-appearing.  Eyes:     Extraocular Movements: Extraocular movements intact.     Conjunctiva/sclera: Conjunctivae normal.     Pupils: Pupils are equal, round, and reactive to light.  Neurological:     Mental Status: She is alert and oriented to person, place, and time. Mental status is at baseline.  Psychiatric:        Mood and Affect: Mood normal.        Behavior: Behavior normal.        Thought Content: Thought content normal.        Judgment: Judgment normal.     Assessment/plan: LILANDRA PALOMEQUE is a 47 y.o. female present for  GAD (generalized anxiety disorder) Improved, but still has rome for improvement.  increase Prozac 10 > 20 mg daily.   Tried:celexa, lexapro, buspar, cymbalta  Return in about 24 weeks (around 01/23/2023) for cpe (20 min), Routine chronic condition follow-up.  No orders of the defined types were placed in this encounter.  Meds ordered this encounter  Medications   FLUoxetine (PROZAC) 20 MG capsule    Sig: Take 1 capsule (20 mg total) by mouth daily.    Dispense:  90 capsule    Refill:  1   Referral Orders  No referral(s) requested today     Note is dictated utilizing voice recognition software. Although note has been proof read prior to signing, occasional typographical errors still can be missed. If any questions arise, please do not hesitate to call for verification.  Electronically signed by: Howard Pouch, DO [place]

## 2022-08-11 ENCOUNTER — Telehealth: Payer: Self-pay

## 2022-08-11 NOTE — Telephone Encounter (Signed)
Abstracted

## 2022-10-01 ENCOUNTER — Ambulatory Visit: Payer: No Typology Code available for payment source | Admitting: Family Medicine

## 2022-10-03 ENCOUNTER — Ambulatory Visit: Payer: Self-pay

## 2022-10-03 ENCOUNTER — Ambulatory Visit: Payer: No Typology Code available for payment source | Admitting: Family Medicine

## 2022-10-03 ENCOUNTER — Encounter: Payer: Self-pay | Admitting: Family Medicine

## 2022-10-03 VITALS — BP 120/75 | HR 96 | Temp 97.7°F | Wt 148.8 lb

## 2022-10-03 DIAGNOSIS — F411 Generalized anxiety disorder: Secondary | ICD-10-CM

## 2022-10-03 MED ORDER — FLUOXETINE HCL 10 MG PO CAPS: 10.0000 mg | ORAL_CAPSULE | Freq: Every day | ORAL | 1 refills | Status: DC

## 2022-10-03 NOTE — Progress Notes (Signed)
Patient ID: Paula Cooke, female  DOB: 1975-10-29, 47 y.o.   MRN: [id number] Patient Care Team    Relationship Specialty Notifications Start End  Natalia Leatherwood, DO PCP - General Family Medicine  04/11/22   Alwyn Pea, MD Referring Physician Cardiology  04/08/22   Concepcion Elk, MD Referring Physician Gastroenterology  04/08/22   Iran Ouch, FNP Physician Assistant Neurology  04/10/22   Schuyler Amor, MD Referring Physician Obstetrics and Gynecology  08/08/22     Chief Complaint  Patient presents with   Depression    Wants to go back to 10 mg of prozac    Subjective:  Paula Cooke is a 47 y.o.  female present  GAD (generalized anxiety disorder) last visit patient was increased to  Prozac 20 mg qd- she had been prescribed 10 mg and felt she could use a higher dose. Today she reports the higher dose made her feel emotional blunted and would like to decrease back to 10 mg of prozac.   Prior note Patient reports she started the Cymbalta for about 1.5 weeks and she felt awful.  She states she fell off and very blunted in her emotions.  She states she is agreeable to trying a different medication today to help with her anxiety. Prior note: Patient reports she was started on BuSpar for her anxiety and it worked okay.  She does not feel it is giving her adequate coverage at this time.  She is compliant with BuSpar 15 mg twice daily.  She states she was also prescribed Prozac but she never started the medication.  She has also been prescribed Klonopin in the past, but does not use frequently and is not using currently.      10/03/2022    9:35 AM 08/08/2022   10:14 AM 05/23/2022   10:25 AM 04/11/2022   10:34 AM 04/11/2022   10:03 AM  Depression screen PHQ 2/9  Decreased Interest 2 0 0 1 1  Down, Depressed, Hopeless 2 0 0 1 1  PHQ - 2 Score 4 0 0 2 2  Altered sleeping 2 0 1    Tired, decreased energy 2 1 1     Change in appetite 2 0 0    Feeling  bad or failure about yourself  0 0 0    Trouble concentrating 2 0 0    Moving slowly or fidgety/restless 1 0 0    Suicidal thoughts 0 0 0    PHQ-9 Score 13 1 2     Difficult doing work/chores Somewhat difficult          10/03/2022    9:37 AM 08/08/2022   10:14 AM 05/23/2022   10:25 AM  GAD 7 : Generalized Anxiety Score  Nervous, Anxious, on Edge 1 2 2   Control/stop worrying 0 0 0  Worry too much - different things 0 0 1  Trouble relaxing 0 0 0  Restless 0 1 0  Easily annoyed or irritable 0 2 1  Afraid - awful might happen 0 0 0  Total GAD 7 Score 1 5 4   Anxiety Difficulty Somewhat difficult            10/03/2022    9:33 AM 04/11/2022   10:03 AM  Fall Risk   Falls in the past year? 0 0  Number falls in past yr: 0 0  Injury with Fall? 0 0  Follow up Falls evaluation completed      Immunization History  Administered Date(s) Administered   Influenza Inj Mdck Quad Pf 03/28/2019, 03/18/2021   Influenza Inj Mdck Quad With Preservative 05/14/2015   Influenza Split 03/28/2019   Influenza, Quadrivalent, Recombinant, Inj, Pf 03/17/2017   Influenza,inj,Quad PF,6+ Mos 03/22/2018, 04/11/2022   Influenza-Unspecified 05/26/2012, 02/25/2013, 04/10/2014, 05/18/2016, 05/18/2017   Tdap 03/12/2013    No results found.  Past Medical History:  Diagnosis Date   Arthritis    Atrial fibrillation    s/p ablation   B12 deficiency    Breast cyst 2022   with aspiration   Chicken pox    Depression    Fainting episodes    Frequent headaches    Hemorrhoid    IBS (irritable bowel syndrome)    Migraines    Mitral valve regurgitation 09/27/2021   Mild   Orthostatic hypotension    Recurrent UTI (urinary tract infection)    Postcoital   Vitamin D deficiency    Allergies  Allergen Reactions   Doxycycline Hyclate     Other reaction(s): Other Hot flashes    Past Surgical History:  Procedure Laterality Date   CARDIAC ELECTROPHYSIOLOGY MAPPING AND ABLATION  2021   A-Fib   CESAREAN  SECTION     2008 & 2011   COLONOSCOPY  02/2022   Dr. Caryl Never   Family History  Problem Relation Age of Onset   Alzheimer's disease Mother    Stroke Father    Hypertension Father    Hypertension Sister    Arthritis Maternal Grandmother    Heart attack Maternal Grandmother    Alzheimer's disease Maternal Grandfather    Cancer Paternal Grandmother    Stroke Paternal Grandfather    Social History   Social History Narrative   Marital status/children/pets: Married.  2 children.   Education/employment: College degree.   Safety:      -smoke alarm in the home:Yes     - wears seatbelt: Yes     - Feels safe in their relationships: Yes       Allergies as of 10/03/2022       Reactions   Doxycycline Hyclate    Other reaction(s): Other Hot flashes         Medication List        Accurate as of October 03, 2022  9:49 AM. If you have any questions, ask your nurse or doctor.          Ajovy 225 MG/1.5ML Soaj Generic drug: Fremanezumab-vfrm Inject into the skin.   aspirin 81 MG chewable tablet Chew by mouth.   baclofen 20 MG tablet Commonly known as: LIORESAL TAKE ONE TABLET EVERY 8 HOURS AS NEEDED FOR MODERATE HEADACHE PAIN.   CRANBERRY PO Take by mouth.   DAILY FIBER PO Take by mouth.   FLUoxetine 10 MG capsule Commonly known as: PROZAC Take 1 capsule (10 mg total) by mouth daily. What changed:  medication strength how much to take Changed by: Felix Pacini, DO   MAGNESIUM PO Take by mouth daily.   melatonin 3 MG Tabs tablet Take 3 mg by mouth as needed.   naratriptan 2.5 MG tablet Commonly known as: AMERGE Take 2.5 mg by mouth daily.   PROBIOTIC PO Take by mouth daily.   Riboflavin 400 MG Tabs Take 400 mg by mouth daily.   zonisamide 50 MG capsule Commonly known as: ZONEGRAN Take 50 mg by mouth at bedtime.        All past medical history, surgical history, allergies, family history, immunizations andmedications were updated in the EMR  today and  reviewed under the history and medication portions of their EMR.     No results found.   ROS 14 pt review of systems performed and negative (unless mentioned in an HPI)  Objective: BP 120/75   Pulse 96   Temp 97.7 F (36.5 C)   Wt 148 lb 12.8 oz (67.5 kg)   SpO2 100%   BMI 23.66 kg/m  Physical Exam Vitals and nursing note reviewed.  Constitutional:      General: She is not in acute distress.    Appearance: Normal appearance. She is normal weight. She is not ill-appearing or toxic-appearing.  Eyes:     Extraocular Movements: Extraocular movements intact.     Conjunctiva/sclera: Conjunctivae normal.     Pupils: Pupils are equal, round, and reactive to light.  Neurological:     Mental Status: She is alert and oriented to person, place, and time. Mental status is at baseline.  Psychiatric:        Mood and Affect: Mood normal.        Behavior: Behavior normal.        Thought Content: Thought content normal.        Judgment: Judgment normal.     Assessment/plan: Paula Cooke is a 47 y.o. female present for  GAD (generalized anxiety disorder) Improved, but still has rome for improvement.  Go back to Prozac 10 . Consider adding a low dose buspar if needed.  Tried:celexa, lexapro, buspar, cymbalta and higher dose prozac  Return for already have physical appt scheduled in August.  No orders of the defined types were placed in this encounter.  Meds ordered this encounter  Medications   FLUoxetine (PROZAC) 10 MG capsule    Sig: Take 1 capsule (10 mg total) by mouth daily.    Dispense:  90 capsule    Refill:  1   Referral Orders  No referral(s) requested today     Note is dictated utilizing voice recognition software. Although note has been proof read prior to signing, occasional typographical errors still can be missed. If any questions arise, please do not hesitate to call for verification.  Electronically signed by: Felix Pacini, DO [facility]  [place]

## 2022-10-03 NOTE — Progress Notes (Signed)
A user error has taken place: encounter opened in error, closed for administrative reasons.

## 2022-10-03 NOTE — Patient Instructions (Addendum)
Return for already have physical appt scheduled in August.        Great to see you today.  I have refilled the medication(s) we provide.   If labs were collected, we will inform you of lab results once received either by echart message or telephone call.   - echart message- for normal results that have been seen by the patient already.   - telephone call: abnormal results or if patient has not viewed results in their echart.

## 2022-10-07 LAB — HM PAP SMEAR: Pap: NEGATIVE

## 2023-01-09 ENCOUNTER — Other Ambulatory Visit: Payer: Self-pay | Admitting: Family Medicine

## 2023-01-23 ENCOUNTER — Ambulatory Visit (INDEPENDENT_AMBULATORY_CARE_PROVIDER_SITE_OTHER): Payer: No Typology Code available for payment source | Admitting: Family Medicine

## 2023-01-23 ENCOUNTER — Encounter: Payer: Self-pay | Admitting: Family Medicine

## 2023-01-23 VITALS — BP 117/82 | HR 69 | Temp 97.8°F | Ht 67.0 in | Wt 148.4 lb

## 2023-01-23 DIAGNOSIS — Z131 Encounter for screening for diabetes mellitus: Secondary | ICD-10-CM

## 2023-01-23 DIAGNOSIS — Z23 Encounter for immunization: Secondary | ICD-10-CM

## 2023-01-23 DIAGNOSIS — Z0000 Encounter for general adult medical examination without abnormal findings: Secondary | ICD-10-CM

## 2023-01-23 DIAGNOSIS — F411 Generalized anxiety disorder: Secondary | ICD-10-CM

## 2023-01-23 DIAGNOSIS — Z1159 Encounter for screening for other viral diseases: Secondary | ICD-10-CM | POA: Diagnosis not present

## 2023-01-23 DIAGNOSIS — Z1231 Encounter for screening mammogram for malignant neoplasm of breast: Secondary | ICD-10-CM | POA: Diagnosis not present

## 2023-01-23 DIAGNOSIS — Z13220 Encounter for screening for lipoid disorders: Secondary | ICD-10-CM

## 2023-01-23 LAB — HEMOGLOBIN A1C: Hgb A1c MFr Bld: 5.1 % (ref 4.6–6.5)

## 2023-01-23 LAB — CBC WITH DIFFERENTIAL/PLATELET
Basophils Absolute: 0 10*3/uL (ref 0.0–0.1)
Basophils Relative: 1 % (ref 0.0–3.0)
Eosinophils Absolute: 0.2 10*3/uL (ref 0.0–0.7)
Eosinophils Relative: 3.6 % (ref 0.0–5.0)
HCT: 41.6 % (ref 36.0–46.0)
Hemoglobin: 13.5 g/dL (ref 12.0–15.0)
Lymphocytes Relative: 32.9 % (ref 12.0–46.0)
Lymphs Abs: 1.6 10*3/uL (ref 0.7–4.0)
MCHC: 32.4 g/dL (ref 30.0–36.0)
MCV: 91.9 fl (ref 78.0–100.0)
Monocytes Absolute: 0.4 10*3/uL (ref 0.1–1.0)
Monocytes Relative: 7.6 % (ref 3.0–12.0)
Neutro Abs: 2.6 10*3/uL (ref 1.4–7.7)
Neutrophils Relative %: 54.9 % (ref 43.0–77.0)
Platelets: 285 10*3/uL (ref 150.0–400.0)
RBC: 4.53 Mil/uL (ref 3.87–5.11)
RDW: 14 % (ref 11.5–15.5)
WBC: 4.7 10*3/uL (ref 4.0–10.5)

## 2023-01-23 LAB — COMPREHENSIVE METABOLIC PANEL
ALT: 14 U/L (ref 0–35)
AST: 16 U/L (ref 0–37)
Albumin: 4.3 g/dL (ref 3.5–5.2)
Alkaline Phosphatase: 69 U/L (ref 39–117)
BUN: 10 mg/dL (ref 6–23)
CO2: 25 mEq/L (ref 19–32)
Calcium: 9 mg/dL (ref 8.4–10.5)
Chloride: 103 mEq/L (ref 96–112)
Creatinine, Ser: 1 mg/dL (ref 0.40–1.20)
GFR: 67.78 mL/min (ref >60.00)
Glucose, Bld: 90 mg/dL (ref 70–99)
Potassium: 4.1 mEq/L (ref 3.5–5.1)
Sodium: 134 mEq/L — ABNORMAL LOW (ref 135–145)
Total Bilirubin: 0.4 mg/dL (ref 0.2–1.2)
Total Protein: 6.6 g/dL (ref 6.0–8.3)

## 2023-01-23 LAB — LIPID PANEL
Cholesterol: 180 mg/dL (ref 0–200)
HDL: 45.8 mg/dL (ref >39.00)
LDL Cholesterol: 112 mg/dL — ABNORMAL HIGH (ref 0–99)
NonHDL: 134.1
Total CHOL/HDL Ratio: 4
Triglycerides: 112 mg/dL (ref 0.0–149.0)
VLDL: 22.4 mg/dL (ref 0.0–40.0)

## 2023-01-23 LAB — TSH: TSH: 1.37 u[IU]/mL (ref 0.35–5.50)

## 2023-01-23 NOTE — Patient Instructions (Addendum)
Return in about 24 weeks (around 07/10/2023) for Routine chronic condition follow-up- can be  virtual .        Great to see you today.  I have refilled the medication(s) we provide.   If labs were collected or images ordered, we will inform you of  results once we have received them and reviewed. We will contact you either by echart message, or telephone call.  Please give ample time to the testing facility, and our office to run,  receive and review results. Please do not call inquiring of results, even if you can see them in your chart. We will contact you as soon as we are able. If it has been over 1 week since the test was completed, and you have not yet heard from [place], then please call us.    - echart message- for normal results that have been seen by the patient already.   - telephone call: abnormal results or if patient has not viewed results in their echart.  If a referral to a specialist was entered for you, please call us in 2 weeks if you have not heard from the specialist office to schedule.

## 2023-01-23 NOTE — Progress Notes (Signed)
Patient ID: Paula Cooke, female  DOB: 1976-02-13, 47 y.o.   MRN: [id number] Patient Care Team    Relationship Specialty Notifications Start End  Natalia Leatherwood, DO PCP - General Family Medicine  04/11/22   Alwyn Pea, MD Referring Physician Cardiology  04/08/22   Concepcion Elk, MD Referring Physician Gastroenterology  04/08/22   Iran Ouch, FNP Physician Assistant Neurology  04/10/22   Schuyler Amor, MD Referring Physician Obstetrics and Gynecology  08/08/22     Chief Complaint  Patient presents with   Annual Exam    Pt is fasting    Subjective: Paula Cooke is a 47 y.o.  female present annual physical and chronic condition combined appointment. All past medical history, surgical history, allergies, family history, immunizations and social history was obtained from the patient today and updated into the electronic medical record.   Health maintenance:  Colonoscopy: Completed 03/15/2022. Normal- 10-year follow-up. Mammogram: Completed 05/16/2022.> ordered for mam bus next week Cervical cancer screening: Completed 03/05/2022-gynecology, normal/negative HPV Immunizations: Tdap due- administered.  Influenza vaccine encouraged yearly during season.  Shingrix vaccines after 50. Infectious disease screening: HIV screening completed.  Hepatitis C- agreeable to screen today DEXA: Completed 07/24/2021-normal  GAD (generalized anxiety disorder) Pt reports prozac 10 mg is working well for her.   Prior note Patient reports she started the Cymbalta for about 1.5 weeks and she felt awful.  She states she fell off and very blunted in her emotions.  She states she is agreeable to trying a different medication today to help with her anxiety. Prior note: Patient reports she was started on BuSpar for her anxiety and it worked okay.  She does not feel it is giving her adequate coverage at this time.  She is compliant with BuSpar 15 mg twice daily.  She states she was  also prescribed Prozac but she never started the medication.  She has also been prescribed Klonopin in the past, but does not use frequently and is not using currently.      10/03/2022    9:35 AM 08/08/2022   10:14 AM 05/23/2022   10:25 AM 04/11/2022   10:34 AM 04/11/2022   10:03 AM  Depression screen PHQ 2/9  Decreased Interest 2 0 0 1 1  Down, Depressed, Hopeless 2 0 0 1 1  PHQ - 2 Score 4 0 0 2 2  Altered sleeping 2 0 1    Tired, decreased energy 2 1 1     Change in appetite 2 0 0    Feeling bad or failure about yourself  0 0 0    Trouble concentrating 2 0 0    Moving slowly or fidgety/restless 1 0 0    Suicidal thoughts 0 0 0    PHQ-9 Score 13 1 2     Difficult doing work/chores Somewhat difficult          10/03/2022    9:37 AM 08/08/2022   10:14 AM 05/23/2022   10:25 AM  GAD 7 : Generalized Anxiety Score  Nervous, Anxious, on Edge 1 2 2   Control/stop worrying 0 0 0  Worry too much - different things 0 0 1  Trouble relaxing 0 0 0  Restless 0 1 0  Easily annoyed or irritable 0 2 1  Afraid - awful might happen 0 0 0  Total GAD 7 Score 1 5 4   Anxiety Difficulty Somewhat difficult            10/03/2022  9:33 AM 04/11/2022   10:03 AM  Fall Risk   Falls in the past year? 0 0  Number falls in past yr: 0 0  Injury with Fall? 0 0  Follow up Falls evaluation completed      Immunization History  Administered Date(s) Administered   Influenza Inj Mdck Quad Pf 05/18/2017, 03/28/2019, 03/18/2021   Influenza Inj Mdck Quad With Preservative 05/14/2015   Influenza Split 03/28/2019   Influenza, Quadrivalent, Recombinant, Inj, Pf 03/17/2017   Influenza,inj,Quad PF,6+ Mos 03/22/2018, 04/11/2022   Influenza-Unspecified 05/26/2012, 02/25/2013, 04/10/2014, 05/18/2016, 05/18/2017   Tdap 03/12/2013, 01/23/2023    No results found.  Past Medical History:  Diagnosis Date   Arthritis    Atrial fibrillation [facility])    s/p ablation   B12 deficiency    Breast cyst 2022   with  aspiration   Chicken pox    Depression    Fainting episodes    Frequent headaches    Hemorrhoid    IBS (irritable bowel syndrome)    Migraines    Mitral valve regurgitation 09/27/2021   Mild   Orthostatic hypotension    Recurrent UTI (urinary tract infection)    Postcoital   Vitamin D deficiency    Allergies  Allergen Reactions   Doxycycline Hyclate     Other reaction(s): Other Hot flashes    Past Surgical History:  Procedure Laterality Date   CARDIAC ELECTROPHYSIOLOGY MAPPING AND ABLATION  2021   A-Fib   CESAREAN SECTION     2008 & 2011   COLONOSCOPY  02/2022   Dr. Caryl Never   Family History  Problem Relation Age of Onset   Alzheimer's disease Mother    Stroke Father    Hypertension Father    Hypertension Sister    Arthritis Maternal Grandmother    Heart attack Maternal Grandmother    Alzheimer's disease Maternal Grandfather    Cancer Paternal Grandmother    Stroke Paternal Grandfather    Social History   Social History Narrative   Marital status/children/pets: Married.  2 children.   Education/employment: College degree.   Safety:      -smoke alarm in the home:Yes     - wears seatbelt: Yes     - Feels safe in their relationships: Yes       Allergies as of 01/23/2023       Reactions   Doxycycline Hyclate    Other reaction(s): Other Hot flashes         Medication List        Accurate as of January 23, 2023  9:08 AM. If you have any questions, ask your nurse or doctor.          Ajovy 225 MG/1.5ML Soaj Generic drug: Fremanezumab-vfrm Inject into the skin.   aspirin 81 MG chewable tablet Chew by mouth.   baclofen 20 MG tablet Commonly known as: LIORESAL TAKE ONE TABLET EVERY 8 HOURS AS NEEDED FOR MODERATE HEADACHE PAIN.   CO Q 10 PO Take 300 mg by mouth daily.   CRANBERRY PO Take by mouth.   DAILY FIBER PO Take by mouth.   FLUoxetine 10 MG capsule Commonly known as: PROZAC Take 1 capsule (10 mg total) by mouth daily.   MAGNESIUM  PO Take by mouth daily.   melatonin 3 MG Tabs tablet Take 3 mg by mouth as needed.   naratriptan 2.5 MG tablet Commonly known as: AMERGE Take 2.5 mg by mouth daily.   PROBIOTIC PO Take by mouth daily.   Riboflavin  400 MG Tabs Take 400 mg by mouth daily.   tretinoin 0.025 % cream Commonly known as: RETIN-A Apply topically.   zonisamide 50 MG capsule Commonly known as: ZONEGRAN Take 50 mg by mouth at bedtime.        All past medical history, surgical history, allergies, family history, immunizations andmedications were updated in the EMR today and reviewed under the history and medication portions of their EMR.     No results found.   ROS 14 pt review of systems performed and negative (unless mentioned in an HPI)  Objective: BP 117/82   Pulse 69   Temp 97.8 F (36.6 C)   Ht 5\' 7"  (1.702 m)   Wt 148 lb 6.4 oz (67.3 kg)   SpO2 99%   BMI 23.24 kg/m  Physical Exam Vitals and nursing note reviewed.  Constitutional:      General: She is not in acute distress.    Appearance: Normal appearance. She is not ill-appearing or toxic-appearing.  HENT:     Head: Normocephalic and atraumatic.     Right Ear: Tympanic membrane, ear canal and external ear normal. There is no impacted cerumen.     Left Ear: Tympanic membrane, ear canal and external ear normal. There is no impacted cerumen.     Nose: No congestion or rhinorrhea.     Mouth/Throat:     Mouth: Mucous membranes are moist.     Pharynx: Oropharynx is clear. No oropharyngeal exudate or posterior oropharyngeal erythema.  Eyes:     General: No scleral icterus.       Right eye: No discharge.        Left eye: No discharge.     Extraocular Movements: Extraocular movements intact.     Conjunctiva/sclera: Conjunctivae normal.     Pupils: Pupils are equal, round, and reactive to light.  Cardiovascular:     Rate and Rhythm: Normal rate and regular rhythm.     Pulses: Normal pulses.     Heart sounds: Normal heart sounds.  No murmur heard.    No friction rub. No gallop.  Pulmonary:     Effort: Pulmonary effort is normal. No respiratory distress.     Breath sounds: Normal breath sounds. No stridor. No wheezing, rhonchi or rales.  Chest:     Chest wall: No tenderness.  Abdominal:     General: Abdomen is flat. Bowel sounds are normal. There is no distension.     Palpations: Abdomen is soft. There is no mass.     Tenderness: There is no abdominal tenderness. There is no right CVA tenderness, left CVA tenderness, guarding or rebound.     Hernia: No hernia is present.  Musculoskeletal:        General: No swelling, tenderness or deformity. Normal range of motion.     Cervical back: Normal range of motion and neck supple. No rigidity or tenderness.     Right lower leg: No edema.     Left lower leg: No edema.  Lymphadenopathy:     Cervical: No cervical adenopathy.  Skin:    General: Skin is warm and dry.     Coloration: Skin is not jaundiced or pale.     Findings: No bruising, erythema, lesion or rash.  Neurological:     General: No focal deficit present.     Mental Status: She is alert and oriented to person, place, and time. Mental status is at baseline.     Cranial Nerves: No cranial nerve deficit.  Sensory: No sensory deficit.     Motor: No weakness.     Coordination: Coordination normal.     Gait: Gait normal.     Deep Tendon Reflexes: Reflexes normal.  Psychiatric:        Mood and Affect: Mood normal.        Behavior: Behavior normal.        Thought Content: Thought content normal.        Judgment: Judgment normal.     Assessment/plan: DEEANNE ARTURO is a 47 y.o. female present for  GAD (generalized anxiety disorder) stable Continue Prozac 10 .  Consider adding a low dose buspar if needed.  Tried:celexa, lexapro, buspar, cymbalta and higher dose prozac  Breast cancer screening by mammogram - MM 3D SCREENING MAMMOGRAM BILATERAL BREAST; Future Encounter for hepatitis C screening test  for low risk patient - Hepatitis C Antibody Lipid screening lipid Diabetes mellitus screening - Lipid panel Need for Tdap vaccination - Tdap vaccine greater than or equal to 7yo IM Routine general medical examination at a health care facility - CBC with Differential/Platelet - Comprehensive metabolic panel - TSH Patient was encouraged to exercise greater than 150 minutes a week. Patient was encouraged to choose a diet filled with fresh fruits and vegetables, and lean meats. AVS provided to patient today for education/recommendation on gender specific health and safety maintenance. Colonoscopy: Completed 03/15/2022. Normal- 10-year follow-up. Mammogram: Completed 05/16/2022.> ordered for mam bus next week Cervical cancer screening: Completed 03/05/2022-gynecology, normal/negative HPV Immunizations: Tdap due- administered.  Influenza vaccine encouraged yearly during season.  Shingrix vaccines after 50. Infectious disease screening: HIV screening completed.  Hepatitis C- agreeable to screen today DEXA: Completed 07/24/2021-normal  Return in about 24 weeks (around 07/10/2023) for Routine chronic condition follow-up- can be  virtual .  Orders Placed This Encounter  Procedures   MM 3D SCREENING MAMMOGRAM BILATERAL BREAST   Tdap vaccine greater than or equal to 7yo IM   CBC with Differential/Platelet   Comprehensive metabolic panel   Hemoglobin A1c   TSH   Lipid panel   Hepatitis C Antibody   No orders of the defined types were placed in this encounter.  Referral Orders  No referral(s) requested today     Note is dictated utilizing voice recognition software. Although note has been proof read prior to signing, occasional typographical errors still can be missed. If any questions arise, please do not hesitate to call for verification.  Electronically signed by: Felix Pacini, DO [facility]

## 2023-01-26 ENCOUNTER — Ambulatory Visit
Admission: RE | Admit: 2023-01-26 | Discharge: 2023-01-26 | Discharge status: Home / Self Care | Payer: No Typology Code available for payment source | Source: Ambulatory Visit | Attending: Family Medicine | Admitting: Family Medicine

## 2023-01-26 DIAGNOSIS — Z1231 Encounter for screening mammogram for malignant neoplasm of breast: Secondary | ICD-10-CM

## 2023-03-27 ENCOUNTER — Encounter: Payer: Self-pay | Admitting: Urgent Care

## 2023-03-27 ENCOUNTER — Ambulatory Visit: Payer: No Typology Code available for payment source | Admitting: Urgent Care

## 2023-03-27 VITALS — BP 120/82 | HR 88 | Temp 98.4°F | Wt 150.8 lb

## 2023-03-27 DIAGNOSIS — N6001 Solitary cyst of right breast: Secondary | ICD-10-CM | POA: Diagnosis not present

## 2023-03-27 MED ORDER — NAPROXEN SODIUM 550 MG PO TABS: 550.0000 mg | ORAL_TABLET | Freq: Two times a day (BID) | ORAL | 0 refills | Status: DC

## 2023-03-27 NOTE — Progress Notes (Signed)
Established Patient Office Visit  Subjective:  Patient ID: Paula Cooke, female    DOB: 05-May-1976  Age: 47 y.o. MRN: [id number]  Chief Complaint  Patient presents with   Cyst    Cyst on right breast. She has tried heat and took Ibuprofen with not much relief    Pt presents with c/o uncomfortable cyst on the R breast. States she has had them before, and current symptoms are similar to previous. It has been there for about a week. She states it is uncomfortable but not painful. Has been applying heat and taking ibuprofen, states it has gone down in size slightly. Denies any external or skin symptoms. No redness to breast or drainage. No nipple discharge. No redness or peeling of skin. She had a cyst drained on Dec. 6, 2022; FINDINGS: "The cyst collapsed completely with aspiration. Approximately 13cc of cloudy straw-colored fluid was aspirated and discarded." Pt states her current sx are in the same location as previous. In August 2022, pt had three cysts in right breast. Pt did just complete her mammogram on January 26, 2023 which was negative for any breast lesions.    Patient Active Problem List   Diagnosis Date Noted   Cyst of right breast 03/27/2023   Polyarthralgia 04/14/2022   S/P ablation of atrial fibrillation 04/14/2022   Migraines 04/11/2022   GAD (generalized anxiety disorder) 02/25/2013   Past Surgical History:  Procedure Laterality Date   CARDIAC ELECTROPHYSIOLOGY MAPPING AND ABLATION  2021   A-Fib   CESAREAN SECTION     2008 & 2011   COLONOSCOPY  02/2022   Dr. Caryl Never      ROS: as noted in HPI  Objective:     BP 120/82   Pulse 88   Temp 98.4 F (36.9 C) (Oral)   Wt 150 lb 12.8 oz (68.4 kg)   SpO2 99%   BMI 23.62 kg/m    Physical Exam Vitals and nursing note reviewed. Exam conducted with a chaperone present.  Constitutional:      General: She is not in acute distress.    Appearance: Normal appearance. She is normal weight. She is not  ill-appearing, toxic-appearing or diaphoretic.  Chest:     Chest wall: No deformity, swelling or tenderness.  Breasts:    Right: Mass present. No swelling, bleeding, inverted nipple, nipple discharge or skin change.     Left: Mass present. No swelling, bleeding, inverted nipple, nipple discharge or skin change.       Comments: B breast mass consistent with prior cyst, much larger on R than L Lymphadenopathy:     Upper Body:     Right upper body: No supraclavicular or axillary adenopathy.     Left upper body: No supraclavicular or axillary adenopathy.  Neurological:     Mental Status: She is alert.      No results found for any visits on 03/27/23.    The 10-year ASCVD risk score (Arnett DK, et al., 2019) is: 0.9%  Assessment & Plan:  Cyst of right breast Assessment & Plan: This appears the be a recurrent issue. She did have aspiration in the past which resolved her sx. She is not wanting to do repeat aspiration if not warranted. I did discuss the following UpToDate recommendations with the patient:  "Simple cyst -- A simple cyst is benign, and no further intervention is necessary [27-29]. Clustered simple microcysts are also benign, and no further intervention is required.  We do not excise simple cysts for  any reason. Fine needle aspiration (F??) of a simple cyst is only performed for signs of infection or inflammation (red skin). In such cases, F?? must be performed under real-time ultrasonographic guidance to assure complete collapse of the cyst [30]. If the aspirated fluid is turbid, it should be sent for culture, but not cytology, as cyst fluid will almost always contain atypical cells. Only frankly bloody fluid should be sent for both culture and cytology. (See "Breast biopsy", section on 'Cyst aspiration'.)  Following F?? of a cyst that completely disappears, no further management is required if there is concordance between the clinical examination and the ?ltr???un? results  [31]. The patient can then resume routine annual screening [16]. (See "Screening for breast cancer: Strategies and recommendations".)  If the aspirated cyst does not completely collapse, the procedure can be converted to an ?ltr??????-guided ??? to obtain a tissue diagnosis. Alternatively, if the FNA is done by palpation alone or by ?ltr???un? not performed by a radiologist, the best practice would be to stop and get a diagnostic mammogram and diagnostic ?ltr?????d first, before pursuing a ?N?."   Pt is already having improvement in her current symptoms. A referral was placed to the breast center in the instance of developing pain, fever, redness of skin, or significant growth.   Encouraged pt to continue moist heat, NSAIDs, and supportive but not compressive bras. Recently negative mammo reassuring  Orders: -     Ambulatory Referral to Breast Specialist -     Naproxen Sodium; Take 1 tablet (550 mg total) by mouth 2 (two) times daily with a meal.  Dispense: 14 tablet; Refill: 0    Pt due for routine annual PE around August 2025 No follow-ups on file.   Maretta Bees, PA

## 2023-03-27 NOTE — Assessment & Plan Note (Signed)
This appears the be a recurrent issue. She did have aspiration in the past which resolved her sx. She is not wanting to do repeat aspiration if not warranted. I did discuss the following UpToDate recommendations with the patient:  "Simple cyst -- A simple cyst is benign, and no further intervention is necessary [27-29]. Clustered simple microcysts are also benign, and no further intervention is required.  We do not excise simple cysts for any reason. Fine needle aspiration (F??) of a simple cyst is only performed for signs of infection or inflammation (red skin). In such cases, F?? must be performed under real-time ultrasonographic guidance to assure complete collapse of the cyst [30]. If the aspirated fluid is turbid, it should be sent for culture, but not cytology, as cyst fluid will almost always contain atypical cells. Only frankly bloody fluid should be sent for both culture and cytology. (See "Breast biopsy", section on 'Cyst aspiration'.)  Following F?? of a cyst that completely disappears, no further management is required if there is concordance between the clinical examination and the ?ltr???un? results [31]. The patient can then resume routine annual screening [16]. (See "Screening for breast cancer: Strategies and recommendations".)  If the aspirated cyst does not completely collapse, the procedure can be converted to an ?ltr??????-guided ??? to obtain a tissue diagnosis. Alternatively, if the FNA is done by palpation alone or by ?ltr???un? not performed by a radiologist, the best practice would be to stop and get a diagnostic mammogram and diagnostic ?ltr?????d first, before pursuing a ?N?."   Pt is already having improvement in her current symptoms. A referral was placed to the breast center in the instance of developing pain, fever, redness of skin, or significant growth.   Encouraged pt to continue moist heat, NSAIDs, and supportive but not compressive bras. Recently negative mammo  reassuring

## 2023-03-27 NOTE — Patient Instructions (Addendum)
You appear to have a simple breast cyst on the right (and likely a smaller one on the left) Because it was a benign cystic fluid noted on prior aspiration, no additional aspiration is needed at this time. If the cyst continues to grow, becomes painful, the skin around the cyst becomes infected, or if you develop a fever, then additional intervention would be needed. For now, please wear loose fitting bras, apply warm moist compresses, and take the NSAID called in today. Do not take any additional Otc nsaids such as motrin or advil while on the anaprox.

## 2023-03-30 ENCOUNTER — Other Ambulatory Visit: Payer: Self-pay | Admitting: Family Medicine

## 2023-05-26 ENCOUNTER — Encounter: Payer: Self-pay | Admitting: Family Medicine

## 2023-05-26 NOTE — Telephone Encounter (Signed)
She can start with a half a tab of BuSpar (7.5 mg) daily for 7 days, then increase to 7.5 mg twice daily.  Will require follow-up prior to needing any refills

## 2023-08-07 ENCOUNTER — Ambulatory Visit: Payer: No Typology Code available for payment source | Admitting: Family Medicine

## 2023-08-07 ENCOUNTER — Encounter: Payer: Self-pay | Admitting: Family Medicine

## 2023-08-07 VITALS — BP 122/80 | HR 86 | Temp 98.0°F | Wt 152.5 lb

## 2023-08-07 DIAGNOSIS — Z23 Encounter for immunization: Secondary | ICD-10-CM

## 2023-08-07 DIAGNOSIS — R5383 Other fatigue: Secondary | ICD-10-CM

## 2023-08-07 DIAGNOSIS — M25511 Pain in right shoulder: Secondary | ICD-10-CM | POA: Insufficient documentation

## 2023-08-07 LAB — IBC + FERRITIN
Ferritin: 11.9 ng/mL (ref 10.0–291.0)
Iron: 94 ug/dL (ref 42–145)
Saturation Ratios: 27.9 % (ref 20.0–50.0)
TIBC: 337.4 ug/dL (ref 250.0–450.0)
Transferrin: 241 mg/dL (ref 212.0–360.0)

## 2023-08-07 LAB — COMPREHENSIVE METABOLIC PANEL
ALT: 12 U/L (ref 0–35)
AST: 14 U/L (ref 0–37)
Albumin: 4.4 g/dL (ref 3.5–5.2)
Alkaline Phosphatase: 69 U/L (ref 39–117)
BUN: 11 mg/dL (ref 6–23)
CO2: 26 meq/L (ref 19–32)
Calcium: 8.9 mg/dL (ref 8.4–10.5)
Chloride: 106 meq/L (ref 96–112)
Creatinine, Ser: 0.86 mg/dL (ref 0.40–1.20)
GFR: 80.92 mL/min (ref >60.00)
Glucose, Bld: 92 mg/dL (ref 70–99)
Potassium: 5.1 meq/L (ref 3.5–5.1)
Sodium: 137 meq/L (ref 135–145)
Total Bilirubin: 0.4 mg/dL (ref 0.2–1.2)
Total Protein: 6.8 g/dL (ref 6.0–8.3)

## 2023-08-07 LAB — B12 AND FOLATE PANEL
Folate: 7.9 ng/mL (ref >5.9)
Vitamin B-12: 216 pg/mL (ref 211–911)

## 2023-08-07 LAB — CBC WITH DIFFERENTIAL/PLATELET
Basophils Absolute: 0 10*3/uL (ref 0.0–0.1)
Basophils Relative: 0.8 % (ref 0.0–3.0)
Eosinophils Absolute: 0.2 10*3/uL (ref 0.0–0.7)
Eosinophils Relative: 2.8 % (ref 0.0–5.0)
HCT: 41.8 % (ref 36.0–46.0)
Hemoglobin: 13.8 g/dL (ref 12.0–15.0)
Lymphocytes Relative: 25.6 % (ref 12.0–46.0)
Lymphs Abs: 1.4 10*3/uL (ref 0.7–4.0)
MCHC: 33 g/dL (ref 30.0–36.0)
MCV: 91.8 fL (ref 78.0–100.0)
Monocytes Absolute: 0.4 10*3/uL (ref 0.1–1.0)
Monocytes Relative: 7.9 % (ref 3.0–12.0)
Neutro Abs: 3.6 10*3/uL (ref 1.4–7.7)
Neutrophils Relative %: 62.9 % (ref 43.0–77.0)
Platelets: 288 10*3/uL (ref 150.0–400.0)
RBC: 4.56 Mil/uL (ref 3.87–5.11)
RDW: 13.6 % (ref 11.5–15.5)
WBC: 5.7 10*3/uL (ref 4.0–10.5)

## 2023-08-07 LAB — TSH: TSH: 1.09 u[IU]/mL (ref 0.35–5.50)

## 2023-08-07 LAB — VITAMIN D 25 HYDROXY (VIT D DEFICIENCY, FRACTURES): VITD: 31.35 ng/mL (ref 30.00–100.00)

## 2023-08-07 LAB — T4, FREE: Free T4: 0.75 ng/dL (ref 0.60–1.60)

## 2023-08-07 MED ORDER — BUSPIRONE HCL 7.5 MG PO TABS
7.5000 mg | ORAL_TABLET | Freq: Two times a day (BID) | ORAL | 1 refills | Status: DC
Start: 2023-08-07 — End: 2023-10-22

## 2023-08-07 NOTE — Patient Instructions (Signed)
 Fatigue If you have fatigue, you feel tired all the time and have a lack of energy or a lack of motivation. Fatigue may make it difficult to start or complete tasks because of exhaustion. Occasional or mild fatigue is often a normal response to activity or life. However, long-term (chronic) or extreme fatigue may be a symptom of a medical condition such as: Depression. Not having enough red blood cells or hemoglobin in the blood (anemia). A problem with a small gland located in the lower front part of the neck (thyroid disorder). Rheumatologic conditions. These are problems related to the body's defense system (immune system). Infections, especially certain viral infections. Fatigue can also lead to negative health outcomes over time. Follow these instructions at home: Medicines Take over-the-counter and prescription medicines only as told by your health care provider. Take a multivitamin if told by your health care provider. Do not use herbal or dietary supplements unless they are approved by your health care provider. Eating and drinking  Avoid heavy meals in the evening. Eat a well-balanced diet, which includes lean proteins, whole grains, plenty of fruits and vegetables, and low-fat dairy products. Avoid eating or drinking too many products with caffeine in them. Avoid alcohol. Drink enough fluid to keep your urine pale yellow. Activity  Exercise regularly, as told by your health care provider. Use or practice techniques to help you relax, such as yoga, tai chi, meditation, or massage therapy. Lifestyle Change situations that cause you stress. Try to keep your work and personal schedules in balance. Do not use recreational or illegal drugs. General instructions Monitor your fatigue for any changes. Go to bed and get up at the same time every day. Avoid fatigue by pacing yourself during the day and getting enough sleep at night. Maintain a healthy weight. Contact a health care  provider if: Your fatigue does not get better. You have a fever. You suddenly lose or gain weight. You have headaches. You have trouble falling asleep or sleeping through the night. You feel angry, guilty, anxious, or sad. You have swelling in your legs or another part of your body. Get help right away if: You feel confused, feel like you might faint, or faint. Your vision is blurry or you have a severe headache. You have severe pain in your abdomen, your back, or the area between your waist and hips (pelvis). You have chest pain, shortness of breath, or an irregular or fast heartbeat. You are unable to urinate, or you urinate less than normal. You have abnormal bleeding from the rectum, nose, lungs, nipples, or, if you are female, the vagina. You vomit blood. You have thoughts about hurting yourself or others. These symptoms may be an emergency. Get help right away. Call 911. Do not wait to see if the symptoms will go away. Do not drive yourself to the hospital. Get help right away if you feel like you may hurt yourself or others, or have thoughts about taking your own life. Go to your nearest emergency room or: Call 911. Call the National Suicide Prevention Lifeline at (262)721-8699 or 988. This is open 24 hours a day. Text the Crisis Text Line at 8450584327. Summary If you have fatigue, you feel tired all the time and have a lack of energy or a lack of motivation. Fatigue may make it difficult to start or complete tasks because of exhaustion. Long-term (chronic) or extreme fatigue may be a symptom of a medical condition. Exercise regularly, as told by your health care provider.  Change situations that cause you stress. Try to keep your work and personal schedules in balance. This information is not intended to replace advice given to you by your health care provider. Make sure you discuss any questions you have with your health care provider. Document Revised: 03/25/2021 Document  Reviewed: 03/25/2021 Elsevier Patient Education  2024 [employer].

## 2023-08-07 NOTE — Progress Notes (Signed)
 Paula Cooke , [birth year], 48 y.o., female MRN: [id number] Patient Care Team    Relationship Specialty Notifications Start End  Natalia Leatherwood, DO PCP - General Family Medicine  04/11/22   Alwyn Pea, MD Referring Physician Cardiology  04/08/22   Concepcion Elk, MD Referring Physician Gastroenterology  04/08/22   Iran Ouch, FNP Physician Assistant Neurology  04/10/22   Schuyler Amor, MD Referring Physician Obstetrics and Gynecology  08/08/22     Chief Complaint  Patient presents with   Fatigue    A few months; fatigue, dizziness, shaky hands. Pt has tried to increase water intake and exercise.      Subjective: Paula Cooke is a 48 y.o. Pt presents for an OV with complaints of fatigue of a few months duration since about summertime.  Patient reports she feels fatigued, sometimes dizzy as shaking her hands.  She reports she feels like she is "powering down "needs to lay down and take a nap.  She states she feels like she sleeps well at nighttime, but she is sleeping more and never feels rested.  She reports she does not snore.  She has a history of migraines and reports she does wake in the middle of the night with headaches at times.  She does wake with headaches in the morning at times.  She takes baclofen during these occasions. She reports her mother had undiagnosed OSA and Alzheimer's.  She currently is not taking vitamin supplements.  She states her menstrual cycle was about once monthly and is not particularly heavy. Cycles every 30d, not heavy .   Echo 09/27/2021 Left Ventricle: Systolic function is normal. EF: 55-60%.    Left Ventricle: There is mild hypertrophy.    Left Ventricle: Left ventricle size is normal.    Left Ventricle: No regional wall motion abnormalities noted.    Left Ventricle: Doppler parameters indicate normal diastolic function.    Left Atrium: Left atrium size is normal.    Mitral Valve: There is mild regurgitation  with a centrally directed  jet.     03/27/2023   10:56 AM 01/23/2023    9:31 AM 10/03/2022    9:35 AM 08/08/2022   10:14 AM 05/23/2022   10:25 AM  Depression screen PHQ 2/9  Decreased Interest 0 0 2 0 0  Down, Depressed, Hopeless 0 0 2 0 0  PHQ - 2 Score 0 0 4 0 0  Altered sleeping 0  2 0 1  Tired, decreased energy 0  2 1 1   Change in appetite 0  2 0 0  Feeling bad or failure about yourself  0  0 0 0  Trouble concentrating 0  2 0 0  Moving slowly or fidgety/restless 0  1 0 0  Suicidal thoughts 0  0 0 0  PHQ-9 Score 0  13 1 2   Difficult doing work/chores Somewhat difficult  Somewhat difficult      Allergies  Allergen Reactions   Doxycycline Hyclate     Other reaction(s): Other Hot flashes    Social History   Social History Narrative   Marital status/children/pets: Married.  2 children.   Education/employment: College degree.   Safety:      -smoke alarm in the home:Yes     - wears seatbelt: Yes     - Feels safe in their relationships: Yes      Past Medical History:  Diagnosis Date   Arthritis    Atrial fibrillation (HCC)  s/p ablation   B12 deficiency    Breast cyst 2022   with aspiration   Chicken pox    Depression    Fainting episodes    Frequent headaches    Hemorrhoid    IBS (irritable bowel syndrome)    Migraines    Mitral valve regurgitation 09/27/2021   Mild   Orthostatic hypotension    Recurrent UTI (urinary tract infection)    Postcoital   Vitamin D deficiency    Past Surgical History:  Procedure Laterality Date   CARDIAC ELECTROPHYSIOLOGY MAPPING AND ABLATION  2021   A-Fib   CESAREAN SECTION     2008 & 2011   COLONOSCOPY  02/2022   Dr. Caryl Never   Family History  Problem Relation Age of Onset   Alzheimer's disease Mother    Stroke Father    Hypertension Father    Hypertension Sister    Arthritis Maternal Grandmother    Heart attack Maternal Grandmother    Alzheimer's disease Maternal Grandfather    Cancer Paternal Grandmother    Stroke  Paternal Grandfather    Allergies as of 08/07/2023       Reactions   Doxycycline Hyclate    Other reaction(s): Other Hot flashes         Medication List        Accurate as of August 07, 2023  3:55 PM. If you have any questions, ask your nurse or doctor.          STOP taking these medications    Ajovy 225 MG/1.5ML Soaj Generic drug: Fremanezumab-vfrm Stopped by: Felix Pacini   CO Q 10 PO Stopped by: Felix Pacini   naproxen sodium 550 MG tablet Commonly known as: Anaprox DS Stopped by: Felix Pacini   tretinoin 0.025 % cream Commonly known as: RETIN-A Stopped by: Felix Pacini       TAKE these medications    aspirin 81 MG chewable tablet Chew by mouth.   baclofen 20 MG tablet Commonly known as: LIORESAL TAKE ONE TABLET EVERY 8 HOURS AS NEEDED FOR MODERATE HEADACHE PAIN.   busPIRone 7.5 MG tablet Commonly known as: BUSPAR Take 1 tablet (7.5 mg total) by mouth 2 (two) times daily. Started by: Felix Pacini   CRANBERRY PO Take by mouth.   DAILY FIBER PO Take by mouth.   FLUoxetine 10 MG capsule Commonly known as: PROZAC TAKE 1 CAPSULE BY MOUTH EVERY DAY   MAGNESIUM PO Take by mouth daily.   melatonin 3 MG Tabs tablet Take 3 mg by mouth as needed.   naratriptan 2.5 MG tablet Commonly known as: AMERGE Take 2.5 mg by mouth daily.   PROBIOTIC PO Take by mouth daily.   Riboflavin 400 MG Tabs Take 400 mg by mouth daily.   zonisamide 50 MG capsule Commonly known as: ZONEGRAN Take 50 mg by mouth at bedtime.        All past medical history, surgical history, allergies, family history, immunizations andmedications were updated in the EMR today and reviewed under the history and medication portions of their EMR.     ROS Negative, with the exception of above mentioned in HPI   Objective:  BP 122/80   Pulse 86   Temp 98 F (36.7 C)   Wt 152 lb 8 oz (69.2 kg)   SpO2 95%   BMI 23.88 kg/m  Body mass index is 23.88 kg/m. Physical  Exam Vitals and nursing note reviewed.  Constitutional:      General: She is not in  acute distress.    Appearance: Normal appearance. She is not ill-appearing, toxic-appearing or diaphoretic.  HENT:     Head: Normocephalic and atraumatic.  Eyes:     General: No scleral icterus.       Right eye: No discharge.        Left eye: No discharge.     Extraocular Movements: Extraocular movements intact.     Conjunctiva/sclera: Conjunctivae normal.     Pupils: Pupils are equal, round, and reactive to light.  Neck:     Comments: Mild fullness of the thyroid present Cardiovascular:     Rate and Rhythm: Normal rate and regular rhythm.  Pulmonary:     Effort: Pulmonary effort is normal. No respiratory distress.     Breath sounds: Normal breath sounds. No wheezing, rhonchi or rales.  Musculoskeletal:     Cervical back: Neck supple. No tenderness.     Right lower leg: No edema.     Left lower leg: No edema.  Lymphadenopathy:     Cervical: No cervical adenopathy.  Skin:    General: Skin is warm.     Findings: No rash.  Neurological:     Mental Status: She is alert and oriented to person, place, and time. Mental status is at baseline.     Motor: No weakness.     Gait: Gait normal.  Psychiatric:        Mood and Affect: Mood normal.        Behavior: Behavior normal.        Thought Content: Thought content normal.        Judgment: Judgment normal.    No results found. No results found. No results found for this or any previous visit (from the past 24 hours).  Assessment/Plan: Paula Cooke is a 48 y.o. female present for OV for   fatigue (Primary) We discussed different causes of fatigue today we will start workup surrounding well anemia, iron deficiency, vitamin deficiencies and lites. Would consider sleep study as next step, if labs do not indicate a cause. - Comp Met (CMET) - CBC w/Diff - TSH - IBC + Ferritin - Vitamin D (25 hydroxy) - B12 and Folate Panel - T4,  free  Influenza vaccine needed - Flu vaccine trivalent PF, 6mos and older(Flulaval,Afluria,Fluarix,Fluzone)  Reviewed expectations re: course of current medical issues. Discussed self-management of symptoms. Outlined signs and symptoms indicating need for more acute intervention. Patient verbalized understanding and all questions were answered. Patient received an After-Visit Summary.    Orders Placed This Encounter  Procedures   Flu vaccine trivalent PF, 6mos and older(Flulaval,Afluria,Fluarix,Fluzone)   Comp Met (CMET)   CBC w/Diff   TSH   IBC + Ferritin   Vitamin D (25 hydroxy)   B12 and Folate Panel   T4, free   Meds ordered this encounter  Medications   busPIRone (BUSPAR) 7.5 MG tablet    Sig: Take 1 tablet (7.5 mg total) by mouth 2 (two) times daily.    Dispense:  180 tablet    Refill:  1   Referral Orders  No referral(s) requested today     Note is dictated utilizing voice recognition software. Although note has been proof read prior to signing, occasional typographical errors still can be missed. If any questions arise, please do not hesitate to call for verification.   electronically signed by:  Felix Pacini, DO  [facility] [place]

## 2023-08-10 ENCOUNTER — Telehealth: Payer: Self-pay | Admitting: Family Medicine

## 2023-08-10 DIAGNOSIS — R400 Somnolence: Secondary | ICD-10-CM

## 2023-08-10 NOTE — Telephone Encounter (Signed)
 Please inform patient Liver, kidney and thyroid functions are normal. Iron, vitamin D, blood cell counts and electrolyte levels are normal. B12 levels are significantly low at 216.  This could be causing some of her fatigue.  I do recommend she start the over-the-counter sublingual B12 daily 1000 mcg.  This comes in solutions or dissolvable tabs that are placed under the tongue for best absorption.  I also placed a referral to pulmonology/sleep clinic for further evaluation and consideration for sleep apnea eval.

## 2023-10-22 ENCOUNTER — Ambulatory Visit: Admitting: Family Medicine

## 2023-10-22 ENCOUNTER — Encounter: Payer: Self-pay | Admitting: Family Medicine

## 2023-10-22 VITALS — BP 116/76 | HR 88 | Temp 98.0°F | Wt 148.2 lb

## 2023-10-22 DIAGNOSIS — F411 Generalized anxiety disorder: Secondary | ICD-10-CM

## 2023-10-22 MED ORDER — BUSPIRONE HCL 7.5 MG PO TABS: 7.5000 mg | ORAL_TABLET | Freq: Two times a day (BID) | ORAL | 1 refills | Status: DC

## 2023-10-22 MED ORDER — FLUOXETINE HCL 10 MG PO CAPS: 10.0000 mg | ORAL_CAPSULE | Freq: Every day | ORAL | 1 refills | Status: DC

## 2023-10-22 NOTE — Addendum Note (Signed)
 Addended by: Napolean Backbone A on: 10/22/2023 09:16 AM   Modules accepted: Level of Service

## 2023-10-22 NOTE — Patient Instructions (Signed)

## 2023-10-22 NOTE — Progress Notes (Signed)
 Patient ID: Paula Cooke, female  DOB: 04-21-1976, 48 y.o.   MRN: [id number] Patient Care Team    Relationship Specialty Notifications Start End  Mariel Shope, DO PCP - General Family Medicine  04/11/22   Dalila Dudley, MD Referring Physician Cardiology  04/08/22   Porfirio Bristol, MD Referring Physician Gastroenterology  04/08/22   Esaw Heckler, FNP Physician Assistant Neurology  04/10/22   Malone Sear, MD Referring Physician Obstetrics and Gynecology  08/08/22     Chief Complaint  Patient presents with   Anxiety    Subjective: Paula Cooke is a 48 y.o.  female present for chronic condition management  . All past medical history, surgical history, allergies, family history, immunizations and social history was obtained from the patient today and updated into the electronic medical record.    Fatigue/b12 def: B12 levels were significantly low at 216.  She is supplementing b12 now and has seen an improvement.   GAD (generalized anxiety disorder) Pt reports prozac  10 mg is working well for her.   Prior note Patient reports she started the Cymbalta  for about 1.5 weeks and she felt awful.  She states she fell off and very blunted in her emotions.  She states she is agreeable to trying a different medication today to help with her anxiety. Prior note: Patient reports she was started on BuSpar  for her anxiety and it worked okay.  She does not feel it is giving her adequate coverage at this time.  She is compliant with BuSpar  15 mg twice daily.  She states she was also prescribed Prozac  but she never started the medication.  She has also been prescribed Klonopin in the past, but does not use frequently and is not using currently.      10/22/2023    8:54 AM 03/27/2023   10:56 AM 01/23/2023    9:31 AM 10/03/2022    9:35 AM 08/08/2022   10:14 AM  Depression screen PHQ 2/9  Decreased Interest 0 0 0 2 0  Down, Depressed, Hopeless 0 0 0 2 0  PHQ - 2 Score  0 0 0 4 0  Altered sleeping 1 0  2 0  Tired, decreased energy 1 0  2 1  Change in appetite 0 0  2 0  Feeling bad or failure about yourself  0 0  0 0  Trouble concentrating 0 0  2 0  Moving slowly or fidgety/restless 0 0  1 0  Suicidal thoughts 0 0  0 0  PHQ-9 Score 2 0  13 1  Difficult doing work/chores Not difficult at all Somewhat difficult  Somewhat difficult       10/22/2023    8:55 AM 10/03/2022    9:37 AM 08/08/2022   10:14 AM 05/23/2022   10:25 AM  GAD 7 : Generalized Anxiety Score  Nervous, Anxious, on Edge 0 1 2 2   Control/stop worrying 0 0 0 0  Worry too much - different things 0 0 0 1  Trouble relaxing 0 0 0 0  Restless 0 0 1 0  Easily annoyed or irritable 0 0 2 1  Afraid - awful might happen 0 0 0 0  Total GAD 7 Score 0 1 5 4   Anxiety Difficulty Not difficult at all Somewhat difficult            10/22/2023    8:54 AM 03/27/2023   10:56 AM 01/23/2023    9:31 AM 10/03/2022  9:33 AM 04/11/2022   10:03 AM  Fall Risk   Falls in the past year? 0 0 0 0 0  Number falls in past yr:  0 0 0 0  Injury with Fall?  0 0 0 0  Risk for fall due to :  No Fall Risks No Fall Risks    Follow up Falls evaluation completed Falls evaluation completed Falls evaluation completed Falls evaluation completed      Immunization History  Administered Date(s) Administered   Influenza Inj Mdck Quad Pf 05/18/2017, 03/28/2019, 03/18/2021   Influenza Inj Mdck Quad With Preservative 05/14/2015   Influenza Split 03/28/2019   Influenza, Quadrivalent, Recombinant, Inj, Pf 03/17/2017   Influenza, Seasonal, Injecte, Preservative Fre 08/07/2023   Influenza,inj,Quad PF,6+ Mos 03/22/2018, 04/11/2022   Influenza-Unspecified 05/26/2012, 02/25/2013, 04/10/2014, 05/18/2016, 05/18/2017   Tdap 03/12/2013, 01/23/2023    No results found.  Past Medical History:  Diagnosis Date   Arthritis    Atrial fibrillation [facility])    s/p ablation   B12 deficiency    Breast cyst 2022   with aspiration   Chicken  pox    Depression    Fainting episodes    Frequent headaches    Hemorrhoid    IBS (irritable bowel syndrome)    Migraines    Mitral valve regurgitation 09/27/2021   Mild   Orthostatic hypotension    Recurrent UTI (urinary tract infection)    Postcoital   Vitamin D  deficiency    Allergies  Allergen Reactions   Doxycycline Hyclate     Other reaction(s): Other Hot flashes    Past Surgical History:  Procedure Laterality Date   CARDIAC ELECTROPHYSIOLOGY MAPPING AND ABLATION  2021   A-Fib   CESAREAN SECTION     2008 & 2011   COLONOSCOPY  02/2022   Dr. Stann Earnest   Family History  Problem Relation Age of Onset   Alzheimer's disease Mother    Stroke Father    Hypertension Father    Hypertension Sister    Arthritis Maternal Grandmother    Heart attack Maternal Grandmother    Alzheimer's disease Maternal Grandfather    Cancer Paternal Grandmother    Stroke Paternal Grandfather    Social History   Social History Narrative   Marital status/children/pets: Married.  2 children.   Education/employment: College degree.   Safety:      -smoke alarm in the home:Yes     - wears seatbelt: Yes     - Feels safe in their relationships: Yes       Allergies as of 10/22/2023       Reactions   Doxycycline Hyclate    Other reaction(s): Other Hot flashes         Medication List        Accurate as of Oct 22, 2023  9:16 AM. If you have any questions, ask your nurse or doctor.          aspirin 81 MG chewable tablet Chew by mouth.   baclofen 20 MG tablet Commonly known as: LIORESAL TAKE ONE TABLET EVERY 8 HOURS AS NEEDED FOR MODERATE HEADACHE PAIN.   busPIRone  7.5 MG tablet Commonly known as: BUSPAR  Take 1 tablet (7.5 mg total) by mouth 2 (two) times daily.   CRANBERRY PO Take by mouth.   DAILY FIBER PO Take by mouth.   FLUoxetine  10 MG capsule Commonly known as: PROZAC  Take 1 capsule (10 mg total) by mouth daily. What changed: how much to take Changed by: Paula Cooke  MAGNESIUM PO Take by mouth daily.   melatonin 3 MG Tabs tablet Take 3 mg by mouth as needed.   naratriptan 2.5 MG tablet Commonly known as: AMERGE Take 2.5 mg by mouth daily.   PROBIOTIC PO Take by mouth daily.   Riboflavin 400 MG Tabs Take 400 mg by mouth daily.   zonisamide 50 MG capsule Commonly known as: ZONEGRAN Take 50 mg by mouth at bedtime.        All past medical history, surgical history, allergies, family history, immunizations andmedications were updated in the EMR today and reviewed under the history and medication portions of their EMR.     No results found.   ROS 14 pt review of systems performed and negative (unless mentioned in an HPI)  Objective: BP 116/76   Pulse 88   Temp 98 F (36.7 C)   Wt 148 lb 3.2 oz (67.2 kg)   SpO2 96%   BMI 23.21 kg/m  Physical Exam Vitals and nursing note reviewed.  Constitutional:      General: She is not in acute distress.    Appearance: Normal appearance. She is normal weight. She is not ill-appearing or toxic-appearing.  HENT:     Head: Normocephalic and atraumatic.  Eyes:     General: No scleral icterus.       Right eye: No discharge.        Left eye: No discharge.     Extraocular Movements: Extraocular movements intact.     Conjunctiva/sclera: Conjunctivae normal.     Pupils: Pupils are equal, round, and reactive to light.  Skin:    Findings: No rash.  Neurological:     Mental Status: She is alert and oriented to person, place, and time. Mental status is at baseline.     Motor: No weakness.     Coordination: Coordination normal.     Gait: Gait normal.  Psychiatric:        Mood and Affect: Mood normal.        Behavior: Behavior normal.        Thought Content: Thought content normal.        Judgment: Judgment normal.     Assessment/plan: Paula Cooke is a 48 y.o. female present for Chronic Conditions/illness Management GAD (generalized anxiety disorder) stable Continue Prozac  10  .  Continue Buspar  7.5 mg BID  Tried:celexa, lexapro, buspar , cymbalta  and higher dose prozac   B12 deficiency: Taking OTC b12 sol 1000 mcg every day and has noticed an improvement in her energy level.     Return in about 24 weeks (around 04/07/2024) for cpe (20 min), Routine chronic condition follow-up.  No orders of the defined types were placed in this encounter.  Meds ordered this encounter  Medications   FLUoxetine  (PROZAC ) 10 MG capsule    Sig: Take 1 capsule (10 mg total) by mouth daily.    Dispense:  90 capsule    Refill:  1   busPIRone  (BUSPAR ) 7.5 MG tablet    Sig: Take 1 tablet (7.5 mg total) by mouth 2 (two) times daily.    Dispense:  180 tablet    Refill:  1   Referral Orders  No referral(s) requested today     Note is dictated utilizing voice recognition software. Although note has been proof read prior to signing, occasional typographical errors still can be missed. If any questions arise, please do not hesitate to call for verification.  Electronically signed by: Napolean Backbone, DO [facility] [place]

## 2024-03-21 DIAGNOSIS — M7501 Adhesive capsulitis of right shoulder: Secondary | ICD-10-CM | POA: Insufficient documentation

## 2024-04-07 ENCOUNTER — Encounter: Payer: Self-pay | Admitting: Family Medicine

## 2024-04-07 ENCOUNTER — Ambulatory Visit (INDEPENDENT_AMBULATORY_CARE_PROVIDER_SITE_OTHER): Admitting: Family Medicine

## 2024-04-07 VITALS — BP 120/80 | HR 82 | Temp 98.1°F | Ht 67.0 in | Wt 156.0 lb

## 2024-04-07 DIAGNOSIS — Z23 Encounter for immunization: Secondary | ICD-10-CM | POA: Diagnosis not present

## 2024-04-07 DIAGNOSIS — Z131 Encounter for screening for diabetes mellitus: Secondary | ICD-10-CM | POA: Diagnosis not present

## 2024-04-07 DIAGNOSIS — F411 Generalized anxiety disorder: Secondary | ICD-10-CM | POA: Diagnosis not present

## 2024-04-07 DIAGNOSIS — Z13220 Encounter for screening for lipoid disorders: Secondary | ICD-10-CM

## 2024-04-07 DIAGNOSIS — E538 Deficiency of other specified B group vitamins: Secondary | ICD-10-CM

## 2024-04-07 DIAGNOSIS — Z1231 Encounter for screening mammogram for malignant neoplasm of breast: Secondary | ICD-10-CM

## 2024-04-07 DIAGNOSIS — Z0000 Encounter for general adult medical examination without abnormal findings: Secondary | ICD-10-CM

## 2024-04-07 DIAGNOSIS — N938 Other specified abnormal uterine and vaginal bleeding: Secondary | ICD-10-CM

## 2024-04-07 LAB — B12 AND FOLATE PANEL
Folate: 11.1 ng/mL (ref >5.9)
Vitamin B-12: 213 pg/mL (ref 211–911)

## 2024-04-07 LAB — CBC
HCT: 42.4 % (ref 36.0–46.0)
Hemoglobin: 14 g/dL (ref 12.0–15.0)
MCHC: 33 g/dL (ref 30.0–36.0)
MCV: 92.9 fl (ref 78.0–100.0)
Platelets: 272 K/uL (ref 150.0–400.0)
RBC: 4.57 Mil/uL (ref 3.87–5.11)
RDW: 14.1 % (ref 11.5–15.5)
WBC: 4.5 K/uL (ref 4.0–10.5)

## 2024-04-07 LAB — COMPREHENSIVE METABOLIC PANEL WITH GFR
ALT: 13 U/L (ref 0–35)
AST: 12 U/L (ref 0–37)
Albumin: 4.3 g/dL (ref 3.5–5.2)
Alkaline Phosphatase: 71 U/L (ref 39–117)
BUN: 11 mg/dL (ref 6–23)
CO2: 29 meq/L (ref 19–32)
Calcium: 8.8 mg/dL (ref 8.4–10.5)
Chloride: 105 meq/L (ref 96–112)
Creatinine, Ser: 0.87 mg/dL (ref 0.40–1.20)
GFR: 79.44 mL/min (ref >60.00)
Glucose, Bld: 88 mg/dL (ref 70–99)
Potassium: 4.7 meq/L (ref 3.5–5.1)
Sodium: 140 meq/L (ref 135–145)
Total Bilirubin: 0.5 mg/dL (ref 0.2–1.2)
Total Protein: 6.6 g/dL (ref 6.0–8.3)

## 2024-04-07 LAB — LIPID PANEL
Cholesterol: 180 mg/dL (ref 0–200)
HDL: 54.3 mg/dL (ref >39.00)
LDL Cholesterol: 108 mg/dL — ABNORMAL HIGH (ref 0–99)
NonHDL: 125.21
Total CHOL/HDL Ratio: 3
Triglycerides: 86 mg/dL (ref 0.0–149.0)
VLDL: 17.2 mg/dL (ref 0.0–40.0)

## 2024-04-07 LAB — TSH: TSH: 2.33 u[IU]/mL (ref 0.35–5.50)

## 2024-04-07 LAB — HEMOGLOBIN A1C: Hgb A1c MFr Bld: 5.4 % (ref 4.6–6.5)

## 2024-04-07 MED ORDER — FLUOXETINE HCL 10 MG PO CAPS: 10.0000 mg | ORAL_CAPSULE | Freq: Every day | ORAL | 1 refills | Status: AC

## 2024-04-07 MED ORDER — BUSPIRONE HCL 7.5 MG PO TABS: 7.5000 mg | ORAL_TABLET | Freq: Two times a day (BID) | ORAL | 1 refills | Status: AC

## 2024-04-07 NOTE — Progress Notes (Signed)
 Patient ID: Paula Cooke, female  DOB: [birth year], 48 y.o.   MRN: [id number] Patient Care Team    Relationship Specialty Notifications Start End  Catherine Charlies LABOR, DO PCP - General Family Medicine  04/11/22   Cornelia Charlyne Birmingham, MD Referring Physician Cardiology  04/08/22   Jefrey Bruckner, MD Referring Physician Gastroenterology  04/08/22   Bluford Jhonny ORN, FNP Physician Assistant Neurology  04/10/22   Jarvis Laroche, MD Referring Physician Obstetrics and Gynecology  08/08/22   Kennyth Rosina BROCKS, FNP Nurse Practitioner Sleep Medicine  04/07/24   Baldwin Norleen DEL., MD Referring Physician Neurology  04/07/24    Comment: headache clinic    Chief Complaint  Patient presents with   Annual Exam    Pt is fasting.  Chronic condition management Influenza vaccine- given Hepatitis B vaccine Offer mammogram last next week and scheduled with patient is agreeable    Subjective: Paula Cooke is a 48 y.o.  female present for CPE and chronic condition management  . All past medical history, surgical history, allergies, family history, immunizations and social history was obtained from the patient today and updated into the electronic medical record.    Health maintenance:  Colonoscopy: Completed 03/15/2022. Normal- 10-year follow-up. Dr. jefrey Mammogram: Completed 01/26/2023.> ordered Cervical cancer screening: Completed 03/05/2022-gynecology, normal/negative HPV Immunizations: Tdap UTD 01/2023.  Influenza vaccine-given(encouraged yearly).  Hep B vaccine- #1 given.Shingrix vaccines encouraged after 50. Infectious disease screening: HIV screening completed.  Hepatitis C-completed DEXA: Completed 07/24/2021-normal follow-up  Fatigue/b12 def: B12 levels were significantly low at 216.  She is supplementing b12 now and has seen an improvement.  GAD (generalized anxiety disorder) Pt reports prozac  10 mg is working well for her.   Prior note Patient reports she started the Cymbalta   for about 1.5 weeks and she felt awful.  She states she fell off and very blunted in her emotions.  She states she is agreeable to trying a different medication today to help with her anxiety. Prior note: Patient reports she was started on BuSpar  for her anxiety and it worked okay.  She does not feel it is giving her adequate coverage at this time.  She is compliant with BuSpar  15 mg twice daily.  She states she was also prescribed Prozac  but she never started the medication.  She has also been prescribed Klonopin in the past, but does not use frequently and is not using currently.      04/07/2024    8:14 AM 10/22/2023    8:54 AM 03/27/2023   10:56 AM 01/23/2023    9:31 AM 10/03/2022    9:35 AM  Depression screen PHQ 2/9  Decreased Interest 0 0 0 0 2  Down, Depressed, Hopeless 0 0 0 0 2  PHQ - 2 Score 0 0 0 0 4  Altered sleeping 0 1 0  2  Tired, decreased energy 0 1 0  2  Change in appetite 0 0 0  2  Feeling bad or failure about yourself  0 0 0  0  Trouble concentrating 0 0 0  2  Moving slowly or fidgety/restless 0 0 0  1  Suicidal thoughts 0 0 0  0  PHQ-9 Score 0 2 0  13  Difficult doing work/chores Not difficult at all Not difficult at all Somewhat difficult  Somewhat difficult      04/07/2024    8:14 AM 10/22/2023    8:55 AM 10/03/2022    9:37 AM 08/08/2022   10:14 AM  GAD 7 : Generalized Anxiety Score  Nervous, Anxious, on Edge 0 0 1 2  Control/stop worrying 0 0 0 0  Worry too much - different things 0 0 0 0  Trouble relaxing 0 0 0 0  Restless 0 0 0 1  Easily annoyed or irritable 0 0 0 2  Afraid - awful might happen 0 0 0 0  Total GAD 7 Score 0 0 1 5  Anxiety Difficulty Not difficult at all Not difficult at all Somewhat difficult           04/07/2024    8:14 AM 10/22/2023    8:54 AM 03/27/2023   10:56 AM 01/23/2023    9:31 AM 10/03/2022    9:33 AM  Fall Risk   Falls in the past year? 0 0 0 0 0  Number falls in past yr: 0  0 0 0  Injury with Fall? 0  0 0 0  Risk for fall  due to :   No Fall Risks No Fall Risks   Follow up Falls evaluation completed Falls evaluation completed Falls evaluation completed Falls evaluation completed Falls evaluation completed     Immunization History  Administered Date(s) Administered   Dtap, Unspecified 02/26/1977, 04/29/1977, 06/25/1977, 06/19/1978, 12/12/1981   Hepb-cpg 04/07/2024   Influenza Inj Mdck Quad Pf 05/18/2017, 03/28/2019, 03/18/2021   Influenza Inj Mdck Quad With Preservative 05/14/2015   Influenza Split 03/28/2019   Influenza, Quadrivalent, Recombinant, Inj, Pf 03/17/2017   Influenza, Seasonal, Injecte, Preservative Fre 08/07/2023, 04/07/2024   Influenza,inj,Quad PF,6+ Mos 03/22/2018, 04/11/2022   Influenza-Unspecified 05/26/2012, 02/25/2013, 04/10/2014, 05/18/2016, 05/18/2017   MMR 03/13/1978, 10/10/1993   Novel Infuenza-h1n1-09 05/31/2008   PFIZER(Purple Top)SARS-COV-2 Vaccination 09/07/2019, 09/28/2019, 05/30/2020   Polio, Unspecified 02/26/1977, 04/29/1977, 06/25/1977, 06/19/1978, 12/12/1981   Td (Adult),unspecified 01/22/1995   Tdap 03/12/2013, 01/23/2023    No results found.  Past Medical History:  Diagnosis Date   Arthritis    Atrial fibrillation [facility])    s/p ablation   B12 deficiency    Breast cyst 2022   with aspiration   Chicken pox    Depression    Fainting episodes    Frequent headaches    Hemorrhoid    IBS (irritable bowel syndrome)    Migraines    Mitral valve regurgitation 09/27/2021   Mild   Orthostatic hypotension    Recurrent UTI (urinary tract infection)    Postcoital   Vitamin D  deficiency    Allergies  Allergen Reactions   Doxycycline Hyclate     Other reaction(s): Other Hot flashes    Past Surgical History:  Procedure Laterality Date   CARDIAC ELECTROPHYSIOLOGY MAPPING AND ABLATION  2021   A-Fib   CESAREAN SECTION     2008 & 2011   COLONOSCOPY  02/2022   Dr. Jefrey   Family History  Problem Relation Age of Onset   Alzheimer's disease Mother    Stroke Father     Hypertension Father    Hypertension Sister    Arthritis Maternal Grandmother    Heart attack Maternal Grandmother    Alzheimer's disease Maternal Grandfather    Cancer Paternal Grandmother    Stroke Paternal Grandfather    Social History   Social History Narrative   Marital status/children/pets: Married.  2 children.   Education/employment: College degree.   Safety:      -smoke alarm in the home:Yes     - wears seatbelt: Yes     - Feels safe in their relationships: Yes  Allergies as of 04/07/2024       Reactions   Doxycycline Hyclate    Other reaction(s): Other Hot flashes         Medication List        Accurate as of April 07, 2024  8:34 AM. If you have any questions, ask your nurse or doctor.          aspirin 81 MG chewable tablet Chew by mouth.   baclofen 20 MG tablet Commonly known as: LIORESAL TAKE ONE TABLET EVERY 8 HOURS AS NEEDED FOR MODERATE HEADACHE PAIN.   busPIRone  7.5 MG tablet Commonly known as: BUSPAR  Take 1 tablet (7.5 mg total) by mouth 2 (two) times daily.   CRANBERRY PO Take by mouth.   DAILY FIBER PO Take by mouth.   FLUoxetine  10 MG capsule Commonly known as: PROZAC  Take 1 capsule (10 mg total) by mouth daily.   MAGNESIUM PO Take by mouth daily.   melatonin 3 MG Tabs tablet Take 3 mg by mouth as needed.   meloxicam 15 MG tablet Commonly known as: MOBIC Take 1 tablet every day by oral route.   naratriptan 2.5 MG tablet Commonly known as: AMERGE Take 2.5 mg by mouth daily.   PROBIOTIC PO Take by mouth daily.   Riboflavin 400 MG Tabs Take 400 mg by mouth daily.   zonisamide 50 MG capsule Commonly known as: ZONEGRAN Take 50 mg by mouth at bedtime.        All past medical history, surgical history, allergies, family history, immunizations andmedications were updated in the EMR today and reviewed under the history and medication portions of their EMR.     No results found.   Review of Systems  All  other systems reviewed and are negative.  14 pt review of systems performed and negative (unless mentioned in an HPI)  Objective: BP 120/80   Pulse 82   Temp 98.1 F (36.7 C)   Ht 5' 7 (1.702 m)   Wt 156 lb (70.8 kg)   LMP 03/21/2024 (Exact Date)   SpO2 97%   BMI 24.43 kg/m  Physical Exam Vitals and nursing note reviewed.  Constitutional:      General: She is not in acute distress.    Appearance: Normal appearance. She is normal weight. She is not ill-appearing or toxic-appearing.  HENT:     Head: Normocephalic and atraumatic.     Right Ear: Tympanic membrane, ear canal and external ear normal. There is no impacted cerumen.     Left Ear: Tympanic membrane, ear canal and external ear normal. There is no impacted cerumen.     Nose: No congestion or rhinorrhea.     Mouth/Throat:     Mouth: Mucous membranes are moist.     Pharynx: Oropharynx is clear. No oropharyngeal exudate or posterior oropharyngeal erythema.  Eyes:     General: No scleral icterus.       Right eye: No discharge.        Left eye: No discharge.     Extraocular Movements: Extraocular movements intact.     Conjunctiva/sclera: Conjunctivae normal.     Pupils: Pupils are equal, round, and reactive to light.  Cardiovascular:     Rate and Rhythm: Normal rate and regular rhythm.     Pulses: Normal pulses.     Heart sounds: Normal heart sounds. No murmur heard.    No friction rub. No gallop.  Pulmonary:     Effort: Pulmonary effort is normal. No respiratory distress.  Breath sounds: Normal breath sounds. No stridor. No wheezing, rhonchi or rales.  Chest:     Chest wall: No tenderness.  Abdominal:     General: Abdomen is flat. Bowel sounds are normal. There is no distension.     Palpations: Abdomen is soft. There is no mass.     Tenderness: There is no abdominal tenderness. There is no right CVA tenderness, left CVA tenderness, guarding or rebound.     Hernia: No hernia is present.  Musculoskeletal:         General: No swelling, tenderness or deformity. Normal range of motion.     Cervical back: Normal range of motion and neck supple. No rigidity or tenderness.     Right lower leg: No edema.     Left lower leg: No edema.  Lymphadenopathy:     Cervical: No cervical adenopathy.  Skin:    General: Skin is warm and dry.     Coloration: Skin is not jaundiced or pale.     Findings: No bruising, erythema, lesion or rash.  Neurological:     General: No focal deficit present.     Mental Status: She is alert and oriented to person, place, and time. Mental status is at baseline.     Cranial Nerves: No cranial nerve deficit.     Sensory: No sensory deficit.     Motor: No weakness.     Coordination: Coordination normal.     Gait: Gait normal.     Deep Tendon Reflexes: Reflexes normal.  Psychiatric:        Mood and Affect: Mood normal.        Behavior: Behavior normal.        Thought Content: Thought content normal.        Judgment: Judgment normal.     Assessment/plan: Paula Cooke is a 48 y.o. female present for CPE and chronic Conditions/illness Management GAD (generalized anxiety disorder) Stable Continue Prozac  10 daily.  Continue Buspar  7.5 mg BID  Tried:celexa, lexapro, buspar , cymbalta  and higher dose prozac   B12 deficiency: Taking OTC b12 sol 1000 mcg every day and has noticed an improvement in her energy level.     Influenza vaccine needed - Flu vaccine trivalent PF, 6mos and older(Flulaval,Afluria,Fluarix,Fluzone) Need for hepatitis B vaccination - Heplisav-B (HepB-CPG) Vaccine Breast cancer screening by mammogram - MM 3D SCREENING MAMMOGRAM BILATERAL BREAST; Future Diabetes mellitus screening - Hemoglobin A1c Lipid screening - Lipid panel DUB (dysfunctional uterine bleeding) She will follow with gyn  Routine general medical examination at a health care facility (Primary) Patient was encouraged to exercise greater than 150 minutes a week. Patient was encouraged to  choose a diet filled with fresh fruits and vegetables, and lean meats. AVS provided to patient today for education/recommendation on gender specific health and safety maintenance. Colonoscopy: Completed 03/15/2022. Normal- 10-year follow-up. Dr. jefrey Mammogram: Completed 01/26/2023.> ordered mam bus Cervical cancer screening: Completed 03/05/2022-gynecology, normal/negative HPV Immunizations: Tdap UTD 01/2023.  Influenza vaccine-given(encouraged yearly).  Hep B vaccine- #1 given.Shingrix vaccines encouraged after 50. Infectious disease screening: HIV screening completed.  Hepatitis C-completed DEXA: Completed 07/24/2021-normal follow-up   Return in about 6 months (around 09/26/2024) for Routine chronic condition follow-up. Nurse visit after 30d for hep B #2  Orders Placed This Encounter  Procedures   MM 3D SCREENING MAMMOGRAM BILATERAL BREAST   Heplisav-B (HepB-CPG) Vaccine   Flu vaccine trivalent PF, 6mos and older(Flulaval,Afluria,Fluarix,Fluzone)   CBC   Comprehensive metabolic panel with GFR   Hemoglobin A1c  Lipid panel   TSH   B12 and Folate Panel   Meds ordered this encounter  Medications   busPIRone  (BUSPAR ) 7.5 MG tablet    Sig: Take 1 tablet (7.5 mg total) by mouth 2 (two) times daily.    Dispense:  180 tablet    Refill:  1   FLUoxetine  (PROZAC ) 10 MG capsule    Sig: Take 1 capsule (10 mg total) by mouth daily.    Dispense:  90 capsule    Refill:  1   Referral Orders  No referral(s) requested today     Note is dictated utilizing voice recognition software. Although note has been proof read prior to signing, occasional typographical errors still can be missed. If any questions arise, please do not hesitate to call for verification.  Electronically signed by: Charlies Bellini, DO [facility] [place]

## 2024-04-07 NOTE — Patient Instructions (Addendum)
 Return in about 6 months (around 09/26/2024) for Routine chronic condition follow-up.  Nurse visit after 30d for hep B #2      Great to see you today.  I have refilled the medication(s) we provide.   If labs were collected or images ordered, we will inform you of  results once we have received them and reviewed. We will contact you either by echart message, or telephone call.  Please give ample time to the testing facility, and our office to run,  receive and review results. Please do not call inquiring of results, even if you can see them in your chart. We will contact you as soon as we are able. If it has been over 1 week since the test was completed, and you have not yet heard from us , then please call us .    - echart message- for normal results that have been seen by the patient already.   - telephone call: abnormal results or if patient has not viewed results in their echart.  If a referral to a specialist was entered for you, please call us  in 2 weeks if you have not heard from the specialist office to schedule.

## 2024-04-08 ENCOUNTER — Ambulatory Visit: Payer: Self-pay | Admitting: Family Medicine

## 2024-04-11 ENCOUNTER — Ambulatory Visit
Admission: RE | Admit: 2024-04-11 | Discharge: 2024-04-11 | Disposition: A | Source: Ambulatory Visit | Attending: Family Medicine | Admitting: Family Medicine

## 2024-04-11 DIAGNOSIS — Z1231 Encounter for screening mammogram for malignant neoplasm of breast: Secondary | ICD-10-CM

## 2024-05-09 ENCOUNTER — Ambulatory Visit (INDEPENDENT_AMBULATORY_CARE_PROVIDER_SITE_OTHER)

## 2024-05-09 DIAGNOSIS — Z23 Encounter for immunization: Secondary | ICD-10-CM | POA: Diagnosis not present

## 2024-05-09 NOTE — Progress Notes (Signed)
 Pt in for Hep B injection.  Injection tolerated well   Vaccine handout provided for pt

## 2024-10-06 ENCOUNTER — Ambulatory Visit: Admitting: Family Medicine
